# Patient Record
Sex: Male | Born: 1960
Health system: Southern US, Community
[De-identification: ages and names within clinical notes are randomized; demographics above are authoritative.]

## PROBLEM LIST (undated history)

## (undated) DIAGNOSIS — I10 Essential (primary) hypertension: Secondary | ICD-10-CM

## (undated) DIAGNOSIS — F1910 Other psychoactive substance abuse, uncomplicated: Secondary | ICD-10-CM

## (undated) DIAGNOSIS — I499 Cardiac arrhythmia, unspecified: Secondary | ICD-10-CM

## (undated) HISTORY — PX: NERVE SURGERY: SHX1016

## (undated) HISTORY — PX: MANDIBLE FRACTURE SURGERY: SHX706

---

## 2013-02-10 DIAGNOSIS — Z720 Tobacco use: Secondary | ICD-10-CM | POA: Insufficient documentation

## 2013-02-10 DIAGNOSIS — J45909 Unspecified asthma, uncomplicated: Secondary | ICD-10-CM | POA: Insufficient documentation

## 2013-02-10 DIAGNOSIS — F1494 Cocaine use, unspecified with cocaine-induced mood disorder: Secondary | ICD-10-CM | POA: Insufficient documentation

## 2013-02-10 DIAGNOSIS — F14988 Cocaine use, unspecified with other cocaine-induced disorder: Secondary | ICD-10-CM | POA: Insufficient documentation

## 2013-02-10 DIAGNOSIS — J40 Bronchitis, not specified as acute or chronic: Secondary | ICD-10-CM | POA: Insufficient documentation

## 2014-07-24 DIAGNOSIS — J040 Acute laryngitis: Secondary | ICD-10-CM | POA: Diagnosis not present

## 2014-08-06 DIAGNOSIS — F333 Major depressive disorder, recurrent, severe with psychotic symptoms: Secondary | ICD-10-CM | POA: Diagnosis not present

## 2014-08-14 DIAGNOSIS — E1151 Type 2 diabetes mellitus with diabetic peripheral angiopathy without gangrene: Secondary | ICD-10-CM | POA: Diagnosis not present

## 2014-08-14 DIAGNOSIS — L84 Corns and callosities: Secondary | ICD-10-CM | POA: Diagnosis not present

## 2014-08-23 DIAGNOSIS — Z79899 Other long term (current) drug therapy: Secondary | ICD-10-CM | POA: Diagnosis not present

## 2014-10-15 DIAGNOSIS — F333 Major depressive disorder, recurrent, severe with psychotic symptoms: Secondary | ICD-10-CM | POA: Diagnosis not present

## 2014-10-30 DIAGNOSIS — F333 Major depressive disorder, recurrent, severe with psychotic symptoms: Secondary | ICD-10-CM | POA: Diagnosis not present

## 2014-11-15 DIAGNOSIS — G44209 Tension-type headache, unspecified, not intractable: Secondary | ICD-10-CM | POA: Insufficient documentation

## 2014-12-18 DIAGNOSIS — I10 Essential (primary) hypertension: Secondary | ICD-10-CM | POA: Diagnosis not present

## 2014-12-18 DIAGNOSIS — R911 Solitary pulmonary nodule: Secondary | ICD-10-CM | POA: Insufficient documentation

## 2014-12-18 DIAGNOSIS — F17210 Nicotine dependence, cigarettes, uncomplicated: Secondary | ICD-10-CM | POA: Diagnosis not present

## 2014-12-18 DIAGNOSIS — F329 Major depressive disorder, single episode, unspecified: Secondary | ICD-10-CM | POA: Diagnosis not present

## 2014-12-18 DIAGNOSIS — Z720 Tobacco use: Secondary | ICD-10-CM | POA: Diagnosis not present

## 2014-12-18 DIAGNOSIS — J439 Emphysema, unspecified: Secondary | ICD-10-CM | POA: Diagnosis not present

## 2014-12-18 DIAGNOSIS — Z716 Tobacco abuse counseling: Secondary | ICD-10-CM | POA: Diagnosis not present

## 2014-12-26 DIAGNOSIS — R911 Solitary pulmonary nodule: Secondary | ICD-10-CM | POA: Diagnosis not present

## 2014-12-26 DIAGNOSIS — J439 Emphysema, unspecified: Secondary | ICD-10-CM | POA: Diagnosis not present

## 2015-01-07 DIAGNOSIS — F333 Major depressive disorder, recurrent, severe with psychotic symptoms: Secondary | ICD-10-CM | POA: Diagnosis not present

## 2015-01-09 DIAGNOSIS — F333 Major depressive disorder, recurrent, severe with psychotic symptoms: Secondary | ICD-10-CM | POA: Diagnosis not present

## 2015-02-13 DIAGNOSIS — F333 Major depressive disorder, recurrent, severe with psychotic symptoms: Secondary | ICD-10-CM | POA: Diagnosis not present

## 2015-04-08 DIAGNOSIS — F333 Major depressive disorder, recurrent, severe with psychotic symptoms: Secondary | ICD-10-CM | POA: Diagnosis not present

## 2015-04-24 DIAGNOSIS — F333 Major depressive disorder, recurrent, severe with psychotic symptoms: Secondary | ICD-10-CM | POA: Diagnosis not present

## 2015-05-15 DIAGNOSIS — F333 Major depressive disorder, recurrent, severe with psychotic symptoms: Secondary | ICD-10-CM | POA: Diagnosis not present

## 2015-08-03 DIAGNOSIS — M79645 Pain in left finger(s): Secondary | ICD-10-CM | POA: Diagnosis not present

## 2015-08-03 DIAGNOSIS — J45909 Unspecified asthma, uncomplicated: Secondary | ICD-10-CM | POA: Diagnosis not present

## 2015-08-03 DIAGNOSIS — S61211A Laceration without foreign body of left index finger without damage to nail, initial encounter: Secondary | ICD-10-CM | POA: Diagnosis not present

## 2015-08-03 DIAGNOSIS — L089 Local infection of the skin and subcutaneous tissue, unspecified: Secondary | ICD-10-CM | POA: Diagnosis not present

## 2015-08-03 DIAGNOSIS — S6992XA Unspecified injury of left wrist, hand and finger(s), initial encounter: Secondary | ICD-10-CM | POA: Diagnosis not present

## 2015-08-03 DIAGNOSIS — F17210 Nicotine dependence, cigarettes, uncomplicated: Secondary | ICD-10-CM | POA: Diagnosis not present

## 2015-08-03 DIAGNOSIS — S60411A Abrasion of left index finger, initial encounter: Secondary | ICD-10-CM | POA: Diagnosis not present

## 2015-08-07 DIAGNOSIS — F333 Major depressive disorder, recurrent, severe with psychotic symptoms: Secondary | ICD-10-CM | POA: Diagnosis not present

## 2015-08-14 DIAGNOSIS — L089 Local infection of the skin and subcutaneous tissue, unspecified: Secondary | ICD-10-CM | POA: Insufficient documentation

## 2015-09-17 DIAGNOSIS — F333 Major depressive disorder, recurrent, severe with psychotic symptoms: Secondary | ICD-10-CM | POA: Diagnosis not present

## 2015-10-05 DIAGNOSIS — R079 Chest pain, unspecified: Secondary | ICD-10-CM | POA: Diagnosis not present

## 2015-10-05 DIAGNOSIS — I440 Atrioventricular block, first degree: Secondary | ICD-10-CM | POA: Diagnosis not present

## 2015-10-05 DIAGNOSIS — R071 Chest pain on breathing: Secondary | ICD-10-CM | POA: Diagnosis not present

## 2015-10-05 DIAGNOSIS — R0602 Shortness of breath: Secondary | ICD-10-CM | POA: Diagnosis not present

## 2015-10-05 DIAGNOSIS — F17210 Nicotine dependence, cigarettes, uncomplicated: Secondary | ICD-10-CM | POA: Diagnosis not present

## 2015-10-05 DIAGNOSIS — J45909 Unspecified asthma, uncomplicated: Secondary | ICD-10-CM | POA: Diagnosis not present

## 2015-10-06 DIAGNOSIS — I440 Atrioventricular block, first degree: Secondary | ICD-10-CM | POA: Diagnosis not present

## 2015-10-06 DIAGNOSIS — R079 Chest pain, unspecified: Secondary | ICD-10-CM | POA: Diagnosis not present

## 2015-11-05 DIAGNOSIS — F333 Major depressive disorder, recurrent, severe with psychotic symptoms: Secondary | ICD-10-CM | POA: Diagnosis not present

## 2015-11-05 DIAGNOSIS — R45851 Suicidal ideations: Secondary | ICD-10-CM | POA: Diagnosis not present

## 2015-11-05 DIAGNOSIS — F329 Major depressive disorder, single episode, unspecified: Secondary | ICD-10-CM | POA: Diagnosis not present

## 2015-11-05 DIAGNOSIS — Z9119 Patient's noncompliance with other medical treatment and regimen: Secondary | ICD-10-CM | POA: Diagnosis not present

## 2015-11-07 DIAGNOSIS — F319 Bipolar disorder, unspecified: Secondary | ICD-10-CM | POA: Diagnosis not present

## 2015-11-13 DIAGNOSIS — Z452 Encounter for adjustment and management of vascular access device: Secondary | ICD-10-CM | POA: Diagnosis not present

## 2015-11-26 DIAGNOSIS — F3181 Bipolar II disorder: Secondary | ICD-10-CM | POA: Diagnosis not present

## 2015-12-10 ENCOUNTER — Emergency Department (HOSPITAL_COMMUNITY)
Admission: EM | Admit: 2015-12-10 | Discharge: 2015-12-11 | Disposition: A | Discharge status: Home / Self Care | Payer: Medicare Other | Attending: Emergency Medicine | Admitting: Emergency Medicine

## 2015-12-10 ENCOUNTER — Encounter (HOSPITAL_COMMUNITY): Payer: Self-pay | Admitting: *Deleted

## 2015-12-10 ENCOUNTER — Emergency Department (HOSPITAL_COMMUNITY): Payer: Medicare Other

## 2015-12-10 DIAGNOSIS — Z5181 Encounter for therapeutic drug level monitoring: Secondary | ICD-10-CM | POA: Diagnosis not present

## 2015-12-10 DIAGNOSIS — R0789 Other chest pain: Secondary | ICD-10-CM | POA: Insufficient documentation

## 2015-12-10 DIAGNOSIS — R079 Chest pain, unspecified: Secondary | ICD-10-CM | POA: Diagnosis not present

## 2015-12-10 DIAGNOSIS — F17200 Nicotine dependence, unspecified, uncomplicated: Secondary | ICD-10-CM | POA: Diagnosis not present

## 2015-12-10 LAB — CBC
HCT: 38.2 % — ABNORMAL LOW (ref 39.0–52.0)
Hemoglobin: 13.2 g/dL (ref 13.0–17.0)
MCH: 33.6 pg (ref 26.0–34.0)
MCHC: 34.6 g/dL (ref 30.0–36.0)
MCV: 97.2 fL (ref 78.0–100.0)
Platelets: 209 10*3/uL (ref 150–400)
RBC: 3.93 MIL/uL — ABNORMAL LOW (ref 4.22–5.81)
RDW: 12.6 % (ref 11.5–15.5)
WBC: 5.3 10*3/uL (ref 4.0–10.5)

## 2015-12-10 LAB — RAPID URINE DRUG SCREEN, HOSP PERFORMED
Amphetamines: NOT DETECTED
Barbiturates: NOT DETECTED
Benzodiazepines: NOT DETECTED
Cocaine: NOT DETECTED
Opiates: NOT DETECTED
Tetrahydrocannabinol: NOT DETECTED

## 2015-12-10 LAB — BASIC METABOLIC PANEL
Anion gap: 5 (ref 5–15)
BUN: 15 mg/dL (ref 6–20)
CO2: 25 mmol/L (ref 22–32)
Calcium: 9.3 mg/dL (ref 8.9–10.3)
Chloride: 108 mmol/L (ref 101–111)
Creatinine, Ser: 1.06 mg/dL (ref 0.61–1.24)
GFR calc Af Amer: >60 mL/min (ref >60)
GFR calc non Af Amer: >60 mL/min (ref >60)
Glucose, Bld: 96 mg/dL (ref 65–99)
Potassium: 4.2 mmol/L (ref 3.5–5.1)
Sodium: 138 mmol/L (ref 135–145)

## 2015-12-10 LAB — D-DIMER, QUANTITATIVE: D-Dimer, Quant: 0.46 ug/mL-FEU (ref 0.00–0.50)

## 2015-12-10 LAB — TROPONIN I: Troponin I: <0.03 ng/mL (ref <0.03)

## 2015-12-10 MED ORDER — NITROGLYCERIN 0.4 MG SL SUBL
0.4000 mg | SUBLINGUAL_TABLET | SUBLINGUAL | Status: DC | PRN
  Administered 2015-12-10: 0.4 mg via SUBLINGUAL
  Filled 2015-12-10: qty 1

## 2015-12-10 MED ORDER — GI COCKTAIL ~~LOC~~
30.0000 mL | Freq: Once | ORAL | Status: AC
  Administered 2015-12-10: 30 mL via ORAL
  Filled 2015-12-10: qty 30

## 2015-12-10 MED ORDER — KETOROLAC TROMETHAMINE 30 MG/ML IJ SOLN
15.0000 mg | Freq: Once | INTRAMUSCULAR | Status: AC
  Administered 2015-12-10: 15 mg via INTRAVENOUS
  Filled 2015-12-10: qty 1

## 2015-12-10 MED ORDER — ACETAMINOPHEN 500 MG PO TABS
1000.0000 mg | ORAL_TABLET | Freq: Once | ORAL | Status: AC
  Administered 2015-12-10: 1000 mg via ORAL
  Filled 2015-12-10: qty 2

## 2015-12-10 MED ORDER — ASPIRIN 81 MG PO CHEW
324.0000 mg | CHEWABLE_TABLET | Freq: Once | ORAL | Status: AC
  Administered 2015-12-10: 324 mg via ORAL
  Filled 2015-12-10: qty 4

## 2015-12-10 NOTE — ED Provider Notes (Signed)
CSN: [id number]     Arrival date & time 12/10/15  2052 History   First MD Initiated Contact with Patient 12/10/15 2101     Chief Complaint  Patient presents with  . Chest Pain      HPI  Pt was seen at 2110. Per pt, c/o gradual onset and persistence of constant left sided chest wall "pain" that began approximately 1530 PTA. Pt states he was sitting in a chair when his symptoms began. Has been associated with SOB. Describes the CP as "sharp" and constant since onset. Endorses hx of similar symptoms, states "they told me it was from doing drugs." States last drug use was 2 months ago. Denies palpitations, no SOB/cough, no abd pain, no N/V/D, no back pain, no rash, no fevers.      History reviewed. No pertinent past medical history.   History reviewed. No pertinent past surgical history.  Social History  Substance Use Topics  . Smoking status: Current Every Day Smoker  . Smokeless tobacco: None  . Alcohol Use: Yes     Comment: last use was Nov 06 2015    Review of Systems ROS: Statement: All systems negative except as marked or noted in the HPI; Constitutional: Negative for fever and chills. ; ; Eyes: Negative for eye pain, redness and discharge. ; ; ENMT: Negative for ear pain, hoarseness, nasal congestion, sinus pressure and sore throat. ; ; Cardiovascular: +CP, SOB. Negative for palpitations, diaphoresis, and peripheral edema. ; ; Respiratory: Negative for cough, wheezing and stridor. ; ; Gastrointestinal: Negative for nausea, vomiting, diarrhea, abdominal pain, blood in stool, hematemesis, jaundice and rectal bleeding. . ; ; Genitourinary: Negative for dysuria, flank pain and hematuria. ; ; Musculoskeletal: Negative for back pain and neck pain. Negative for swelling and trauma.; ; Skin: Negative for pruritus, rash, abrasions, blisters, bruising and skin lesion.; ; Neuro: Negative for headache, lightheadedness and neck stiffness. Negative for weakness, altered level of consciousness, altered  mental status, extremity weakness, paresthesias, involuntary movement, seizure and syncope.      Allergies  Review of patient's allergies indicates no known allergies.  Home Medications   Prior to Admission medications   Not on File   BP 139/77 mmHg  Pulse 68  Temp(Src) 97.8 F (36.6 C) (Oral)  Resp 18  Ht 5\' 6"  (1.676 m)  Wt 145 lb (65.772 kg)  BMI 23.41 kg/m2  SpO2 98% Physical Exam  2115: Physical examination:  Nursing notes reviewed; Vital signs and O2 SAT reviewed;  Constitutional: Well developed, Well nourished, Well hydrated, In no acute distress; Head:  Normocephalic, atraumatic; Eyes: EOMI, PERRL, No scleral icterus; ENMT: Mouth and pharynx normal, Mucous membranes moist; Neck: Supple, Full range of motion, No lymphadenopathy; Cardiovascular: Regular rate and rhythm, No gallop; Respiratory: Breath sounds clear & equal bilaterally, No wheezes.  Speaking full sentences with ease, Normal respiratory effort/excursion; Chest: +mild TTP left mid-chest wall. No deformity, no soft tissue crepitus. Movement normal; Abdomen: Soft, Nontender, Nondistended, Normal bowel sounds; Genitourinary: No CVA tenderness; Extremities: Pulses normal, No tenderness, No edema, No calf edema or asymmetry.; Neuro: AA&Ox3, Major CN grossly intact.  Speech clear. No gross focal motor or sensory deficits in extremities.; Skin: Color normal, Warm, Dry.   ED Course  Procedures (including critical care time) Labs Review  Imaging Review  I have personally reviewed and evaluated these images and lab results as part of my medical decision-making.   EKG Interpretation   Date/Time:  Tuesday December 10 2015 21:01:43 EDT Ventricular Rate:  68 PR Interval:    QRS Duration: 87 QT Interval:  388 QTC Calculation: 413 R Axis:   77 Text Interpretation:  Sinus rhythm Borderline prolonged PR interval No old  tracing to compare Confirmed by [facility] Center IncMCMANUS  MD, Nicholos JohnsKATHLEEN [phone] on 12/10/2015  9:16:03 PM      MDM   MDM Reviewed: nursing note and vitals Interpretation: labs, ECG and x-ray     Results for orders placed or performed during the hospital encounter of 12/10/15  Basic metabolic panel  Result Value Ref Range   Sodium 138 135 - 145 mmol/L   Potassium 4.2 3.5 - 5.1 mmol/L   Chloride 108 101 - 111 mmol/L   CO2 25 22 - 32 mmol/L   Glucose, Bld 96 65 - 99 mg/dL   BUN 15 6 - 20 mg/dL   Creatinine, Ser 7.821.06 0.61 - 1.24 mg/dL   Calcium 9.3 8.9 - 95.610.3 mg/dL   GFR calc non Af Amer >60 >60 mL/min   GFR calc Af Amer >60 >60 mL/min   Anion gap 5 5 - 15  CBC  Result Value Ref Range   WBC 5.3 4.0 - 10.5 K/uL   RBC 3.93 (L) 4.22 - 5.81 MIL/uL   Hemoglobin 13.2 13.0 - 17.0 g/dL   HCT 21.338.2 (L) 08.639.0 - 57.852.0 %   MCV 97.2 78.0 - 100.0 fL   MCH 33.6 26.0 - 34.0 pg   MCHC 34.6 30.0 - 36.0 g/dL   RDW 46.912.6 62.911.5 - 52.815.5 %   Platelets 209 150 - 400 K/uL  Troponin I  Result Value Ref Range   Troponin I <0.03 <0.03 ng/mL  Urine rapid drug screen (hosp performed)  Result Value Ref Range   Opiates NONE DETECTED NONE DETECTED   Cocaine NONE DETECTED NONE DETECTED   Benzodiazepines NONE DETECTED NONE DETECTED   Amphetamines NONE DETECTED NONE DETECTED   Tetrahydrocannabinol NONE DETECTED NONE DETECTED   Barbiturates NONE DETECTED NONE DETECTED  D-dimer, quantitative  Result Value Ref Range   D-Dimer, Quant 0.46 0.00 - 0.50 ug/mL-FEU   Dg Chest 2 View 12/10/2015  CLINICAL DATA:  Acute onset of left-sided chest pain and shortness of breath. Initial encounter. EXAM: CHEST  2 VIEW COMPARISON:  None. FINDINGS: The lungs are well-aerated and clear. There is no evidence of focal opacification, pleural effusion or pneumothorax. The heart is normal in size; the mediastinal contour is within normal limits. No acute osseous abnormalities are seen. IMPRESSION: No acute cardiopulmonary process seen. Electronically Signed   By: Roanna RaiderJeffery  Chang M.D.   On: 12/10/2015 22:04    2310:  No change in CP after ASA, SL ntg and  GI cocktail. Workup reassuring. Will check delta troponin at 0030 (9 hours after onset of symptoms).  Sign out to Dr. Lynelle DoctorKnapp.   Samuel JesterKathleen Luca Burston, DO 12/10/15 2314

## 2015-12-10 NOTE — ED Notes (Signed)
Pt c/o left side chest pain that is described as sharp with sob that started this afternoon, denies any n/v, radiation of pain,

## 2015-12-11 LAB — TROPONIN I: Troponin I: <0.03 ng/mL (ref <0.03)

## 2015-12-11 MED ORDER — NAPROXEN 250 MG PO TABS: ORAL_TABLET | ORAL | Status: DC

## 2015-12-11 MED ORDER — CYCLOBENZAPRINE HCL 5 MG PO TABS: 5.0000 mg | ORAL_TABLET | Freq: Three times a day (TID) | ORAL | Status: DC | PRN

## 2015-12-11 NOTE — Discharge Instructions (Signed)
Use ice or heat to the area for comfort if the pain returns. Take the medication as prescribed if the pain returns. Recheck if you get a cough, fever, struggle to breathe or the pain seems to be getting worse instead of better.    Chest Wall Pain Chest wall pain is pain in or around the bones and muscles of your chest. Sometimes, an injury causes this pain. Sometimes, the cause may not be known. This pain may take several weeks or longer to get better. HOME CARE Pay attention to any changes in your symptoms. Take these actions to help with your pain:  Rest as told by your doctor.  Avoid activities that cause pain. Try not to use your chest, belly (abdominal), or side muscles to lift heavy things.  If directed, apply ice to the painful area:  Put ice in a plastic bag.  Place a towel between your skin and the bag.  Leave the ice on for 20 minutes, 2-3 times per day.  Take over-the-counter and prescription medicines only as told by your doctor.  Do not use tobacco products, including cigarettes, chewing tobacco, and e-cigarettes. If you need help quitting, ask your doctor.  Keep all follow-up visits as told by your doctor. This is important. GET HELP IF:  You have a fever.  Your chest pain gets worse.  You have new symptoms. GET HELP RIGHT AWAY IF:  You feel sick to your stomach (nauseous) or you throw up (vomit).  You feel sweaty or light-headed.  You have a cough with phlegm (sputum) or you cough up blood.  You are short of breath.   This information is not intended to replace advice given to you by your health care provider. Make sure you discuss any questions you have with your health care provider.   Document Released: 11/11/2007 Document Revised: 02/13/2015 Document Reviewed: 08/20/2014 Elsevier Interactive Patient Education [employer]2016 [employer] Inc.

## 2015-12-11 NOTE — ED Notes (Signed)
[facility]; pt stated he would walk to residence

## 2015-12-11 NOTE — ED Provider Notes (Signed)
Patient was left at change of shift to get a second, delta troponin for left-sided chest pain. His chest pain started about 3 PM this afternoon. He describes it as sharp and constant and points to his left chest area medial to his nipple line. He denies any change in his activity. Patient was given the results of his second troponin which was negative. He states his pain is gone at this time and he feels ready to be discharged.  Patient is staying in a recovery house for relapsing on cocaine after being sober for 3 years. He however reports he has not done it for 2 months. He is requesting paperwork showing what medications he got in the ED tonight. He was informed his discharge papers will show what medications he was prescribed.  . Results for orders placed or performed during the hospital encounter of 12/10/15  Basic metabolic panel  Result Value Ref Range   Sodium 138 135 - 145 mmol/L   Potassium 4.2 3.5 - 5.1 mmol/L   Chloride 108 101 - 111 mmol/L   CO2 25 22 - 32 mmol/L   Glucose, Bld 96 65 - 99 mg/dL   BUN 15 6 - 20 mg/dL   Creatinine, Ser 1.611.06 0.61 - 1.24 mg/dL   Calcium 9.3 8.9 - 09.610.3 mg/dL   GFR calc non Af Amer >60 >60 mL/min   GFR calc Af Amer >60 >60 mL/min   Anion gap 5 5 - 15  CBC  Result Value Ref Range   WBC 5.3 4.0 - 10.5 K/uL   RBC 3.93 (L) 4.22 - 5.81 MIL/uL   Hemoglobin 13.2 13.0 - 17.0 g/dL   HCT 04.538.2 (L) 40.939.0 - 81.152.0 %   MCV 97.2 78.0 - 100.0 fL   MCH 33.6 26.0 - 34.0 pg   MCHC 34.6 30.0 - 36.0 g/dL   RDW 91.412.6 78.211.5 - 95.615.5 %   Platelets 209 150 - 400 K/uL  Troponin I  Result Value Ref Range   Troponin I <0.03 <0.03 ng/mL  Urine rapid drug screen (hosp performed)  Result Value Ref Range   Opiates NONE DETECTED NONE DETECTED   Cocaine NONE DETECTED NONE DETECTED   Benzodiazepines NONE DETECTED NONE DETECTED   Amphetamines NONE DETECTED NONE DETECTED   Tetrahydrocannabinol NONE DETECTED NONE DETECTED   Barbiturates NONE DETECTED NONE DETECTED  D-dimer,  quantitative  Result Value Ref Range   D-Dimer, Quant 0.46 0.00 - 0.50 ug/mL-FEU  Troponin I  Result Value Ref Range   Troponin I <0.03 <0.03 ng/mL   Laboratory interpretation all normal   Diagnoses that have been ruled out:  None  Diagnoses that are still under consideration:  None  Final diagnoses:  Atypical chest pain    New Prescriptions   CYCLOBENZAPRINE (FLEXERIL) 5 MG TABLET    Take 1 tablet (5 mg total) by mouth 3 (three) times daily as needed.   NAPROXEN (NAPROSYN) 250 MG TABLET    Take 1 po BID with food prn pain    Plan discharge  Devoria AlbeIva Syed Zukas, MD, [facility] PyoFACEP      Evora Schechter, MD 12/11/15 0120

## 2015-12-23 DIAGNOSIS — F329 Major depressive disorder, single episode, unspecified: Secondary | ICD-10-CM | POA: Diagnosis not present

## 2016-04-15 DIAGNOSIS — I2510 Atherosclerotic heart disease of native coronary artery without angina pectoris: Secondary | ICD-10-CM | POA: Diagnosis not present

## 2016-04-15 DIAGNOSIS — E559 Vitamin D deficiency, unspecified: Secondary | ICD-10-CM | POA: Diagnosis not present

## 2016-04-15 DIAGNOSIS — Z13220 Encounter for screening for lipoid disorders: Secondary | ICD-10-CM | POA: Diagnosis not present

## 2016-04-15 DIAGNOSIS — Z0389 Encounter for observation for other suspected diseases and conditions ruled out: Secondary | ICD-10-CM | POA: Diagnosis not present

## 2016-04-15 DIAGNOSIS — Z8659 Personal history of other mental and behavioral disorders: Secondary | ICD-10-CM | POA: Diagnosis not present

## 2016-04-15 DIAGNOSIS — Z125 Encounter for screening for malignant neoplasm of prostate: Secondary | ICD-10-CM | POA: Diagnosis not present

## 2016-04-15 DIAGNOSIS — R5383 Other fatigue: Secondary | ICD-10-CM | POA: Diagnosis not present

## 2016-04-29 DIAGNOSIS — I2510 Atherosclerotic heart disease of native coronary artery without angina pectoris: Secondary | ICD-10-CM | POA: Diagnosis not present

## 2016-04-29 DIAGNOSIS — Z23 Encounter for immunization: Secondary | ICD-10-CM | POA: Diagnosis not present

## 2016-04-29 DIAGNOSIS — E559 Vitamin D deficiency, unspecified: Secondary | ICD-10-CM | POA: Diagnosis not present

## 2016-04-29 DIAGNOSIS — R5383 Other fatigue: Secondary | ICD-10-CM | POA: Diagnosis not present

## 2016-05-07 DIAGNOSIS — F329 Major depressive disorder, single episode, unspecified: Secondary | ICD-10-CM | POA: Diagnosis not present

## 2016-05-19 ENCOUNTER — Encounter (HOSPITAL_COMMUNITY): Payer: Self-pay | Admitting: Emergency Medicine

## 2016-05-19 ENCOUNTER — Emergency Department (HOSPITAL_COMMUNITY): Payer: Medicare Other

## 2016-05-19 ENCOUNTER — Emergency Department (HOSPITAL_COMMUNITY)
Admission: EM | Admit: 2016-05-19 | Discharge: 2016-05-19 | Disposition: A | Discharge status: Home / Self Care | Payer: Medicare Other | Attending: Emergency Medicine | Admitting: Emergency Medicine

## 2016-05-19 DIAGNOSIS — R05 Cough: Secondary | ICD-10-CM | POA: Diagnosis not present

## 2016-05-19 DIAGNOSIS — R0789 Other chest pain: Secondary | ICD-10-CM | POA: Diagnosis not present

## 2016-05-19 DIAGNOSIS — R0602 Shortness of breath: Secondary | ICD-10-CM | POA: Diagnosis not present

## 2016-05-19 DIAGNOSIS — F17210 Nicotine dependence, cigarettes, uncomplicated: Secondary | ICD-10-CM | POA: Insufficient documentation

## 2016-05-19 DIAGNOSIS — R079 Chest pain, unspecified: Secondary | ICD-10-CM | POA: Diagnosis not present

## 2016-05-19 HISTORY — DX: Cardiac arrhythmia, unspecified: I49.9

## 2016-05-19 LAB — CBC
HCT: 43.9 % (ref 39.0–52.0)
Hemoglobin: 14.4 g/dL (ref 13.0–17.0)
MCH: 32.2 pg (ref 26.0–34.0)
MCHC: 32.8 g/dL (ref 30.0–36.0)
MCV: 98.2 fL (ref 78.0–100.0)
Platelets: 223 10*3/uL (ref 150–400)
RBC: 4.47 MIL/uL (ref 4.22–5.81)
RDW: 12.4 % (ref 11.5–15.5)
WBC: 6 10*3/uL (ref 4.0–10.5)

## 2016-05-19 LAB — BASIC METABOLIC PANEL
Anion gap: 6 (ref 5–15)
BUN: 15 mg/dL (ref 6–20)
CO2: 25 mmol/L (ref 22–32)
Calcium: 9.6 mg/dL (ref 8.9–10.3)
Chloride: 106 mmol/L (ref 101–111)
Creatinine, Ser: 1.57 mg/dL — ABNORMAL HIGH (ref 0.61–1.24)
GFR calc Af Amer: 55 mL/min — ABNORMAL LOW (ref >60)
GFR calc non Af Amer: 47 mL/min — ABNORMAL LOW (ref >60)
Glucose, Bld: 96 mg/dL (ref 65–99)
Potassium: 3.9 mmol/L (ref 3.5–5.1)
Sodium: 137 mmol/L (ref 135–145)

## 2016-05-19 LAB — I-STAT TROPONIN, ED
Troponin i, poc: 0 ng/mL (ref 0.00–0.08)
Troponin i, poc: 0 ng/mL (ref 0.00–0.08)

## 2016-05-19 NOTE — Discharge Instructions (Signed)
Your heart work up was reassuring today.  Return for worsening symptoms,including worsening pain, passing out, difficulty breathing or any other symptoms concerning to you.  Follow-up with your heart doctor at [facility].

## 2016-05-19 NOTE — ED Provider Notes (Signed)
[facility]-EMERGENCY DEPT Provider Note   CSN: [id number] Arrival date & time: 05/19/16  1757     History   Chief Complaint Chief Complaint  Patient presents with  . Chest Pain    HPI Chad Frank is a 55 y.o. male.  HPI 55 year old male who presents with chest pain. He has a history of tobacco use and prior history of drug abuse, and has been in remission for 3 months. Presents with chest pain after waking up from a nap at around 5:55 PM tonight. Pain described as pressure, gradually worsening, and associated with mild shortness of breath. States that he has had similar pain in the past, primarily when he is angry and under a lot of stress. States that symptoms usually go away when he is able to be by himself and able to relax. States that this is near the anniversary of his father's death 1 year ago around Christmas time, and states that her only child passed away after [place] this year as well.  Denies any cough, fever, diaphoresis. Denies that pain is worse with position movement or exertional activity. No family history of cardiac disease. No prior history of DVT or PE or family history of that. No recent immobilization, lower extremity edema or calf tenderness.  Past Medical History:  Diagnosis Date  . Irregular heart rhythm     There are no active problems to display for this patient.   History reviewed. No pertinent surgical history.     Home Medications    Prior to Admission medications   Medication Sig Start Date End Date Taking? Authorizing Provider  FLUoxetine (PROZAC) 20 MG capsule Take 20 mg by mouth daily. 04/28/16  Yes Historical Provider, MD  traZODone (DESYREL) 100 MG tablet Take 100 mg by mouth at bedtime. 04/28/16  Yes Historical Provider, MD    Family History History reviewed. No pertinent family history.  Social History Social History  Substance Use Topics  . Smoking status: Current Every Day Smoker    Packs/day: 0.50    Types: Cigarettes  .  Smokeless tobacco: Never Used  . Alcohol use No     Comment: last use was Nov 06 2015     Allergies   Patient has no known allergies.   Review of Systems Review of Systems 10/14 systems reviewed and are negative other than those stated in the HPI   Physical Exam Updated Vital Signs BP 134/70   Pulse (!) 58   Temp 97.9 F (36.6 C) (Oral)   Resp 15   Ht 5\' 6"  (1.676 m)   Wt 160 lb (72.6 kg)   SpO2 96%   BMI 25.82 kg/m   Physical Exam Physical Exam  Nursing note and vitals reviewed. Constitutional: Well developed, well nourished, non-toxic, and in no acute distress Head: Normocephalic and atraumatic.  Mouth/Throat: Oropharynx is clear and moist.  Neck: Normal range of motion. Neck supple.  Cardiovascular: Normal rate and regular rhythm.   Pulmonary/Chest: Effort normal and breath sounds normal.  Abdominal: Soft. There is no tenderness. There is no rebound and no guarding.  Musculoskeletal: Normal range of motion.  Neurological: Alert, no facial droop, fluent speech, moves all extremities symmetrically Skin: Skin is warm and dry.  Psychiatric: Cooperative    ED Treatments / Results  Labs (all labs ordered are listed, but only abnormal results are displayed) Labs Reviewed  BASIC METABOLIC PANEL - Abnormal; Notable for the following:       Result Value   Creatinine, Ser 1.57 (*)  GFR calc non Af Amer 47 (*)    GFR calc Af Amer 55 (*)    All other components within normal limits  CBC  I-STAT TROPOININ, ED  I-STAT TROPOININ, ED    EKG  EKG Interpretation  Date/Time:  Tuesday May 19 2016 19:17:09 EST Ventricular Rate:  54 PR Interval:    QRS Duration: 84 QT Interval:  404 QTC Calculation: 383 R Axis:   70 Text Interpretation:  Sinus rhythm Prolonged PR interval nonspecific t-wave changes similar to last EKG  Confirmed by Kamonte Mcmichen MD, Rease Wence [phone]([id number]) on 05/19/2016 7:37:18 PM       Radiology Dg Chest 2 View  Result Date: 05/19/2016 CLINICAL DATA:   Patient states left lower chest pain x 20 minutes ago. SOB and coughing with occasional phlegm. States he has a hx of having fluid around his heart. Hx of irregular heart beat, bronchitis, and current smoker: half a pack x 37 years. EXAM: CHEST  2 VIEW COMPARISON:  12/10/2015 FINDINGS: The heart size and mediastinal contours are within normal limits. Mild interstitial accentuation, as can commonly be encountered in smokers. The lungs appear otherwise clear. The visualized skeletal structures are unremarkable. IMPRESSION: No active cardiopulmonary disease. Electronically Signed   By: Gaylyn RongWalter  Liebkemann M.D.   On: 05/19/2016 19:00    Procedures Procedures (including critical care time)  Medications Ordered in ED Medications - No data to display   Initial Impression / Assessment and Plan / ED Course  I have reviewed the triage vital signs and the nursing notes.  Pertinent labs & imaging results that were available during my care of the patient were reviewed by me and considered in my medical decision making (see chart for details).  Clinical Course     Presenting with chest pressure in the setting of stressful events recently. He is nontoxic in no acute distress with normal vital signs. His low risk for ACS but does have risk factors of age and tobacco use. Serial troponins are normal and serial EKGs without acute ischemic changes or dynamic changes. He is felt to be ruled out at this time given his low risk status. Chest x-ray visualized and shows no acute cardiopulmonary processes and the rest of his blood work is reassuring. History not suggestive of PE and no concerns for dissection or other serious intrathoracic or cardiopulmonary processes at this time. D[facility]ed [facility]. Strict return and follow-up instructions reviewed. He expressed understanding of all discharge instructions and felt comfortable with the plan of care.   Final  Clinical Impressions(s) / ED Diagnoses   Final diagnoses:  Nonspecific chest pain    New Prescriptions New Prescriptions   No medications on file     Lavera Guiseana Duo Bo Teicher, MD 05/19/16 2122

## 2016-05-19 NOTE — ED Triage Notes (Signed)
Pt reports left side chest pain since waking up from nap this afternoon. Pt reports history of same with unknown etiology. Pt alert and oriented. Pt denies sob. Pt reports increase in pain with movement.

## 2016-05-29 DIAGNOSIS — R079 Chest pain, unspecified: Secondary | ICD-10-CM | POA: Diagnosis not present

## 2016-05-29 DIAGNOSIS — F329 Major depressive disorder, single episode, unspecified: Secondary | ICD-10-CM | POA: Diagnosis not present

## 2016-05-29 DIAGNOSIS — F419 Anxiety disorder, unspecified: Secondary | ICD-10-CM | POA: Diagnosis not present

## 2016-05-29 DIAGNOSIS — F331 Major depressive disorder, recurrent, moderate: Secondary | ICD-10-CM | POA: Insufficient documentation

## 2016-05-29 DIAGNOSIS — R0789 Other chest pain: Secondary | ICD-10-CM | POA: Diagnosis not present

## 2016-05-29 DIAGNOSIS — J45909 Unspecified asthma, uncomplicated: Secondary | ICD-10-CM | POA: Diagnosis not present

## 2016-05-29 DIAGNOSIS — Z79899 Other long term (current) drug therapy: Secondary | ICD-10-CM | POA: Diagnosis not present

## 2016-05-29 DIAGNOSIS — F17200 Nicotine dependence, unspecified, uncomplicated: Secondary | ICD-10-CM | POA: Diagnosis not present

## 2016-05-29 DIAGNOSIS — F418 Other specified anxiety disorders: Secondary | ICD-10-CM | POA: Diagnosis not present

## 2016-06-04 DIAGNOSIS — I2510 Atherosclerotic heart disease of native coronary artery without angina pectoris: Secondary | ICD-10-CM | POA: Diagnosis not present

## 2016-06-04 DIAGNOSIS — Z8659 Personal history of other mental and behavioral disorders: Secondary | ICD-10-CM | POA: Diagnosis not present

## 2016-06-04 DIAGNOSIS — R5383 Other fatigue: Secondary | ICD-10-CM | POA: Diagnosis not present

## 2016-08-03 DIAGNOSIS — I10 Essential (primary) hypertension: Secondary | ICD-10-CM | POA: Diagnosis not present

## 2016-08-24 DIAGNOSIS — F329 Major depressive disorder, single episode, unspecified: Secondary | ICD-10-CM | POA: Diagnosis not present

## 2016-08-27 DIAGNOSIS — I10 Essential (primary) hypertension: Secondary | ICD-10-CM | POA: Diagnosis not present

## 2016-09-25 DIAGNOSIS — Z0000 Encounter for general adult medical examination without abnormal findings: Secondary | ICD-10-CM | POA: Diagnosis not present

## 2016-09-25 DIAGNOSIS — Z23 Encounter for immunization: Secondary | ICD-10-CM | POA: Diagnosis not present

## 2016-09-25 DIAGNOSIS — F17210 Nicotine dependence, cigarettes, uncomplicated: Secondary | ICD-10-CM | POA: Diagnosis not present

## 2016-09-25 DIAGNOSIS — D649 Anemia, unspecified: Secondary | ICD-10-CM | POA: Insufficient documentation

## 2016-09-25 DIAGNOSIS — M545 Low back pain: Secondary | ICD-10-CM | POA: Diagnosis not present

## 2016-09-25 DIAGNOSIS — E785 Hyperlipidemia, unspecified: Secondary | ICD-10-CM | POA: Diagnosis not present

## 2016-09-25 DIAGNOSIS — Z720 Tobacco use: Secondary | ICD-10-CM | POA: Diagnosis not present

## 2016-09-25 DIAGNOSIS — I10 Essential (primary) hypertension: Secondary | ICD-10-CM | POA: Diagnosis not present

## 2016-09-25 DIAGNOSIS — F419 Anxiety disorder, unspecified: Secondary | ICD-10-CM | POA: Diagnosis not present

## 2016-09-25 DIAGNOSIS — H547 Unspecified visual loss: Secondary | ICD-10-CM | POA: Diagnosis not present

## 2016-09-25 DIAGNOSIS — D539 Nutritional anemia, unspecified: Secondary | ICD-10-CM | POA: Diagnosis not present

## 2016-09-25 DIAGNOSIS — R799 Abnormal finding of blood chemistry, unspecified: Secondary | ICD-10-CM | POA: Diagnosis not present

## 2016-09-25 DIAGNOSIS — F329 Major depressive disorder, single episode, unspecified: Secondary | ICD-10-CM | POA: Diagnosis not present

## 2016-09-25 DIAGNOSIS — G43909 Migraine, unspecified, not intractable, without status migrainosus: Secondary | ICD-10-CM | POA: Diagnosis not present

## 2016-09-25 DIAGNOSIS — J4530 Mild persistent asthma, uncomplicated: Secondary | ICD-10-CM | POA: Diagnosis not present

## 2016-09-25 DIAGNOSIS — R911 Solitary pulmonary nodule: Secondary | ICD-10-CM | POA: Diagnosis not present

## 2016-09-25 DIAGNOSIS — Z7982 Long term (current) use of aspirin: Secondary | ICD-10-CM | POA: Diagnosis not present

## 2016-09-25 DIAGNOSIS — Z79899 Other long term (current) drug therapy: Secondary | ICD-10-CM | POA: Diagnosis not present

## 2016-10-13 ENCOUNTER — Emergency Department (HOSPITAL_COMMUNITY): Payer: Medicare Other

## 2016-10-13 ENCOUNTER — Observation Stay (HOSPITAL_COMMUNITY)
Admission: EM | Admit: 2016-10-13 | Discharge: 2016-10-14 | Disposition: A | Discharge status: Home / Self Care | Payer: Medicare Other | Attending: Internal Medicine | Admitting: Internal Medicine

## 2016-10-13 ENCOUNTER — Encounter (HOSPITAL_COMMUNITY): Payer: Self-pay | Admitting: Emergency Medicine

## 2016-10-13 DIAGNOSIS — R079 Chest pain, unspecified: Secondary | ICD-10-CM | POA: Diagnosis present

## 2016-10-13 DIAGNOSIS — F17210 Nicotine dependence, cigarettes, uncomplicated: Secondary | ICD-10-CM | POA: Insufficient documentation

## 2016-10-13 DIAGNOSIS — R0789 Other chest pain: Principal | ICD-10-CM | POA: Insufficient documentation

## 2016-10-13 DIAGNOSIS — Z79899 Other long term (current) drug therapy: Secondary | ICD-10-CM | POA: Insufficient documentation

## 2016-10-13 DIAGNOSIS — Z7982 Long term (current) use of aspirin: Secondary | ICD-10-CM | POA: Diagnosis not present

## 2016-10-13 DIAGNOSIS — I10 Essential (primary) hypertension: Secondary | ICD-10-CM | POA: Diagnosis not present

## 2016-10-13 DIAGNOSIS — F419 Anxiety disorder, unspecified: Secondary | ICD-10-CM | POA: Diagnosis not present

## 2016-10-13 DIAGNOSIS — E785 Hyperlipidemia, unspecified: Secondary | ICD-10-CM

## 2016-10-13 DIAGNOSIS — R9431 Abnormal electrocardiogram [ECG] [EKG]: Secondary | ICD-10-CM

## 2016-10-13 DIAGNOSIS — R0602 Shortness of breath: Secondary | ICD-10-CM | POA: Diagnosis not present

## 2016-10-13 HISTORY — DX: Essential (primary) hypertension: I10

## 2016-10-13 LAB — CBC WITH DIFFERENTIAL/PLATELET
Basophils Absolute: 0 10*3/uL (ref 0.0–0.1)
Basophils Relative: 0 %
Eosinophils Absolute: 0.2 10*3/uL (ref 0.0–0.7)
Eosinophils Relative: 3 %
HCT: 38.4 % — ABNORMAL LOW (ref 39.0–52.0)
Hemoglobin: 12.7 g/dL — ABNORMAL LOW (ref 13.0–17.0)
Lymphocytes Relative: 27 %
Lymphs Abs: 1.5 10*3/uL (ref 0.7–4.0)
MCH: 32.4 pg (ref 26.0–34.0)
MCHC: 33.1 g/dL (ref 30.0–36.0)
MCV: 98 fL (ref 78.0–100.0)
Monocytes Absolute: 0.6 10*3/uL (ref 0.1–1.0)
Monocytes Relative: 11 %
Neutro Abs: 3.4 10*3/uL (ref 1.7–7.7)
Neutrophils Relative %: 59 %
Platelets: 253 10*3/uL (ref 150–400)
RBC: 3.92 MIL/uL — ABNORMAL LOW (ref 4.22–5.81)
RDW: 12.8 % (ref 11.5–15.5)
WBC: 5.7 10*3/uL (ref 4.0–10.5)

## 2016-10-13 LAB — COMPREHENSIVE METABOLIC PANEL
ALT: 17 U/L (ref 17–63)
AST: 23 U/L (ref 15–41)
Albumin: 3.7 g/dL (ref 3.5–5.0)
Alkaline Phosphatase: 68 U/L (ref 38–126)
Anion gap: 6 (ref 5–15)
BUN: 11 mg/dL (ref 6–20)
CO2: 26 mmol/L (ref 22–32)
Calcium: 8.8 mg/dL — ABNORMAL LOW (ref 8.9–10.3)
Chloride: 106 mmol/L (ref 101–111)
Creatinine, Ser: 1.09 mg/dL (ref 0.61–1.24)
GFR calc Af Amer: >60 mL/min (ref >60)
GFR calc non Af Amer: >60 mL/min (ref >60)
Glucose, Bld: 116 mg/dL — ABNORMAL HIGH (ref 65–99)
Potassium: 3.9 mmol/L (ref 3.5–5.1)
Sodium: 138 mmol/L (ref 135–145)
Total Bilirubin: 0.5 mg/dL (ref 0.3–1.2)
Total Protein: 7.2 g/dL (ref 6.5–8.1)

## 2016-10-13 LAB — TROPONIN I
Troponin I: <0.03 ng/mL (ref <0.03)
Troponin I: <0.03 ng/mL (ref <0.03)
Troponin I: <0.03 ng/mL (ref <0.03)
Troponin I: <0.03 ng/mL (ref <0.03)

## 2016-10-13 LAB — RAPID URINE DRUG SCREEN, HOSP PERFORMED
Amphetamines: NOT DETECTED
Barbiturates: NOT DETECTED
Benzodiazepines: NOT DETECTED
Cocaine: NOT DETECTED
Opiates: NOT DETECTED
Tetrahydrocannabinol: NOT DETECTED

## 2016-10-13 MED ORDER — ENOXAPARIN SODIUM 40 MG/0.4ML ~~LOC~~ SOLN
40.0000 mg | SUBCUTANEOUS | Status: DC
  Administered 2016-10-13: 40 mg via SUBCUTANEOUS
  Filled 2016-10-13: qty 0.4

## 2016-10-13 MED ORDER — GI COCKTAIL ~~LOC~~: 30.0000 mL | Freq: Four times a day (QID) | ORAL | Status: DC | PRN

## 2016-10-13 MED ORDER — TRAZODONE HCL 50 MG PO TABS
100.0000 mg | ORAL_TABLET | Freq: Every day | ORAL | Status: DC
  Administered 2016-10-13: 100 mg via ORAL
  Filled 2016-10-13: qty 2

## 2016-10-13 MED ORDER — ACETAMINOPHEN 325 MG PO TABS: 650.0000 mg | ORAL_TABLET | ORAL | Status: DC | PRN

## 2016-10-13 MED ORDER — ASPIRIN EC 81 MG PO TBEC
81.0000 mg | DELAYED_RELEASE_TABLET | Freq: Every day | ORAL | Status: DC
  Administered 2016-10-14: 81 mg via ORAL
  Filled 2016-10-13: qty 1

## 2016-10-13 MED ORDER — ONDANSETRON HCL 4 MG/2ML IJ SOLN: 4.0000 mg | Freq: Four times a day (QID) | INTRAMUSCULAR | Status: DC | PRN

## 2016-10-13 MED ORDER — FLUOXETINE HCL 20 MG PO CAPS
20.0000 mg | ORAL_CAPSULE | Freq: Every day | ORAL | Status: DC
  Administered 2016-10-14: 20 mg via ORAL
  Filled 2016-10-13: qty 1

## 2016-10-13 MED ORDER — AMLODIPINE BESYLATE 5 MG PO TABS
5.0000 mg | ORAL_TABLET | Freq: Every day | ORAL | Status: DC
  Administered 2016-10-14: 5 mg via ORAL
  Filled 2016-10-13: qty 1

## 2016-10-13 MED ORDER — ATORVASTATIN CALCIUM 40 MG PO TABS: 40.0000 mg | ORAL_TABLET | Freq: Every day | ORAL | Status: DC

## 2016-10-13 NOTE — ED Provider Notes (Signed)
[facility]-EMERGENCY DEPT Provider Note   CSN: [id number] Arrival date & time: 10/13/16  1007  By signing my name below, I, Doreatha Martin, attest that this documentation has been prepared under the direction and in the presence of Doug Sou, MD. Electronically Signed: Doreatha Martin, ED Scribe. 10/13/16. 10:58 AM.     History   Chief Complaint Chief Complaint  Patient presents with  . Chest Pain    HPI Geraldine Tesar is a 56 y.o. male with h/o HTN, HLD who presents to the Emergency Department complaining of moderate, intermittent, sharp, left-sided chest discomfort that began at 5 this morning and woke him from sleep. Pt states he is not currently in pain, and his pain lasts approximately 2 minutes at a time. He also reports he was breathing heavily with onset of his pain. Pt states he has had similar pain in the past, has been evaluated by an MD and was advised to take baby ASA. He tried his inhaler with no relief of symptoms. Of note, pt received bad news about his mothers health last night. No h/o MI, DM, or cardiac cath. Pt has had stress test previously, and states it was an abnormal result regarding his HR. He was previously followed by a cardiologist 4-5 years ago. Pt reports a previous 40 year history of drug abuse, with last use 1 year ago.  He denies recent illicit drug use. He states he only smoked crack and never used IV drugs. He is a current smoker and former drinker of 1 year. NKDA. He denies other associated symptoms.  He is presently pain-free and asymptomatic. Treated by EMS with aspirin The history is provided by the patient. No language interpreter was used.    Past Medical History:  Diagnosis Date  . Hypertension   . Irregular heart rhythm    Hypercholesterolemia There are no active problems to display for this patient.   History reviewed. No pertinent surgical history.     Home Medications    Prior to Admission medications   Medication Sig Start Date End Date  Taking? Authorizing Provider  acetaminophen (TYLENOL) 325 MG tablet Take 325 mg by mouth every 6 (six) hours as needed for mild pain or headache.   Yes [provider]  albuterol (PROVENTIL HFA;VENTOLIN HFA) 108 (90 Base) MCG/ACT inhaler Inhale 2 puffs into the lungs 2 (two) times daily. 09/25/16 09/25/17 Yes [provider]  amLODipine (NORVASC) 5 MG tablet Take 5 mg by mouth daily. 09/25/16  Yes [provider]  aspirin EC 81 MG tablet Take 81 mg by mouth daily as needed. 09/25/16  Yes [provider]  atorvastatin (LIPITOR) 40 MG tablet Take 40 mg by mouth daily. 09/25/16  Yes [provider]  FLUoxetine (PROZAC) 20 MG capsule Take 20 mg by mouth daily. 04/28/16  Yes [provider]  traZODone (DESYREL) 100 MG tablet Take 100 mg by mouth at bedtime. 04/28/16  Yes [provider]    Family History History reviewed. No pertinent family history. Family history negative for cardiac disease Social History Social History  Substance Use Topics  . Smoking status: Current Every Day Smoker    Packs/day: 0.50    Types: Cigarettes  . Smokeless tobacco: Never Used  . Alcohol use No     Comment: last use was Nov 06 2015  No alcohol 1 year no illicit drugs for 1 year. Former user of crack cocaine. No history of IV drug use   Allergies   Patient has no known  allergies.   Review of Systems Review of Systems  Constitutional: Negative.   HENT: Negative.   Respiratory: Positive for shortness of breath.   Cardiovascular: Positive for chest pain.  Gastrointestinal: Negative.   Musculoskeletal: Negative.   Skin: Negative.   Neurological: Negative.   Psychiatric/Behavioral: Negative.   All other systems reviewed and are negative.    Physical Exam Updated Vital Signs BP 125/71 (BP Location: Left Arm)   Pulse 65   Temp 97.8 F (36.6 C) (Oral)   Resp 18   Ht 5\' 6"  (1.676 m)   Wt 160 lb (72.6 kg)   SpO2 98%   BMI 25.82 kg/m    Physical Exam  Constitutional: He appears well-developed and well-nourished.  HENT:  Head: Normocephalic and atraumatic.  Eyes: Conjunctivae are normal. Pupils are equal, round, and reactive to light.  Neck: Neck supple. No tracheal deviation present. No thyromegaly present.  Cardiovascular: Normal rate, regular rhythm and normal heart sounds.   No murmur heard. Pulmonary/Chest: Effort normal and breath sounds normal.  Abdominal: Soft. Bowel sounds are normal. He exhibits no distension. There is no tenderness.  Musculoskeletal: Normal range of motion. He exhibits no edema or tenderness.  Neurological: He is alert. Coordination normal.  Skin: Skin is warm and dry. No rash noted.  Psychiatric: He has a normal mood and affect.  Nursing note and vitals reviewed.    ED Treatments / Results   DIAGNOSTIC STUDIES: Oxygen Saturation is 98% on RA, normal by my interpretation.    COORDINATION OF CARE: 10:53 AM Discussed treatment plan with pt at bedside which includes labs and pt agreed to plan.    Labs (all labs ordered are listed, but only abnormal results are displayed) Labs Reviewed  COMPREHENSIVE METABOLIC PANEL  CBC WITH DIFFERENTIAL/PLATELET  TROPONIN I    EKG  EKG Interpretation  Date/Time:  Tuesday Oct 13 2016 10:12:50 EDT Ventricular Rate:  65 PR Interval:    QRS Duration: 81 QT Interval:  385 QTC Calculation: 401 R Axis:   73 Text Interpretation:  Sinus rhythm Consider left ventricular hypertrophy Nonspecific T abnormalities, diffuse leads Anterior ST elevation, probably due to LVH Baseline wander in lead(s) V4 Confirmed by Ethelda Chick  MD, Tyree Fluharty ([id number]) on 10/13/2016 10:17:34 AM       Radiology No results found.  Procedures Procedures (including critical care time)  Medications Ordered in ED Medications - No data to display  11:25 AM patient remains asymptomatic. Dr. Ardyth Harps consulted will arrange for 23 hour observation and evaluation of chest pain.  Telemetry. Chest x-ray viewed by me Results for orders placed or performed during the hospital encounter of 10/13/16  Comprehensive metabolic panel  Result Value Ref Range   Sodium 138 135 - 145 mmol/L   Potassium 3.9 3.5 - 5.1 mmol/L   Chloride 106 101 - 111 mmol/L   CO2 26 22 - 32 mmol/L   Glucose, Bld 116 (H) 65 - 99 mg/dL   BUN 11 6 - 20 mg/dL   Creatinine, Ser 1.61 0.61 - 1.24 mg/dL   Calcium 8.8 (L) 8.9 - 10.3 mg/dL   Total Protein 7.2 6.5 - 8.1 g/dL   Albumin 3.7 3.5 - 5.0 g/dL   AST 23 15 - 41 U/L   ALT 17 17 - 63 U/L   Alkaline Phosphatase 68 38 - 126 U/L   Total Bilirubin 0.5 0.3 - 1.2 mg/dL   GFR calc non Af Amer >60 >60 mL/min   GFR calc Af Amer >60 >60 mL/min  Anion gap 6 5 - 15  CBC with Differential  Result Value Ref Range   WBC 5.7 4.0 - 10.5 K/uL   RBC 3.92 (L) 4.22 - 5.81 MIL/uL   Hemoglobin 12.7 (L) 13.0 - 17.0 g/dL   HCT 96.038.4 (L) 45.439.0 - 09.852.0 %   MCV 98.0 78.0 - 100.0 fL   MCH 32.4 26.0 - 34.0 pg   MCHC 33.1 30.0 - 36.0 g/dL   RDW 11.912.8 14.711.5 - 82.915.5 %   Platelets 253 150 - 400 K/uL   Neutrophils Relative % 59 %   Neutro Abs 3.4 1.7 - 7.7 K/uL   Lymphocytes Relative 27 %   Lymphs Abs 1.5 0.7 - 4.0 K/uL   Monocytes Relative 11 %   Monocytes Absolute 0.6 0.1 - 1.0 K/uL   Eosinophils Relative 3 %   Eosinophils Absolute 0.2 0.0 - 0.7 K/uL   Basophils Relative 0 %   Basophils Absolute 0.0 0.0 - 0.1 K/uL  Troponin I  Result Value Ref Range   Troponin I <0.03 <0.03 ng/mL   Dg Chest 2 View  Result Date: 10/13/2016 CLINICAL DATA:  56 year old male with chest pain and some shortness breath relieved by inhaler. Hypertension. Initial encounter. EXAM: CHEST  2 VIEW COMPARISON:  05/19/2016. FINDINGS: Chronic lung changes without infiltrate, congestive heart failure or pneumothorax. No plain film evidence of pulmonary malignancy. Heart size top-normal. No acute osseous abnormality. IMPRESSION: Chronic lung changes without acute abnormality noted. Electronically  Signed   By: Lacy DuverneySteven  Olson M.D.   On: 10/13/2016 10:53   Initial Impression / Assessment and Plan / ED Course  I have reviewed the triage vital signs and the nursing notes.  Pertinent labs & imaging results that were available during my care of the patient were reviewed by me and considered in my medical decision making (see chart for details).     Heart score equals 4 based on history, EKG criteria and cardiac risk factors and age  Final Clinical Impressions(s) / ED Diagnoses  Diagnosis #1 chest pain arrest #2 tobacco abuse Final diagnoses:  None    New Prescriptions New Prescriptions   No medications on file    I personally performed the services described in this documentation, which was scribed in my presence. The recorded information has been reviewed and considered.     Doug SouJacubowitz, Nichoel Digiulio, MD 10/13/16 1134

## 2016-10-13 NOTE — ED Triage Notes (Signed)
PT c/o center tightness chest pain that started this am with some SOB relieved by his inhaler per pt. EMS states they gave pt 324mg  aspirin in route.

## 2016-10-13 NOTE — H&P (Signed)
History and Physical   wi Chad RoyalsJames Ra ZOX:[id number] DOB: [birth year] DOA: 10/13/2016  Referring MD/NP/PA: Doug Sousam Jacubowitz, EDP PCP: Patient, No Pcp Per  Patient coming from: Home  Chief Complaint: Chest Pain  HPI: Chad Frank is a 56 y.o. male with h/o HTN and hyperlipidemia who was awoken today at around 4:30 am from sleep with SSCP, no chest pain. Improved with nitro given in ED. VSS, he was found to have inferolateral t wave inversions on EKG with a HEART score of 4 and admission was requested. At the time of my evaluation he does not have CP.  Past Medical/Surgical History: Past Medical History:  Diagnosis Date  . Hypertension   . Irregular heart rhythm     History reviewed. No pertinent surgical history.  Social History:  reports that he has been smoking Cigarettes.  He has been smoking about 0.50 packs per day. He has never used smokeless tobacco. He reports that he does not drink alcohol or use drugs.  Allergies: No Known Allergies  Family History:  Is not aware of any relevant family history: "I am not sure what problems my folks had"  Prior to Admission medications   Medication Sig Start Date End Date Taking? Authorizing Provider  acetaminophen (TYLENOL) 325 MG tablet Take 325 mg by mouth every 6 (six) hours as needed for mild pain or headache.   Yes [provider]  albuterol (PROVENTIL HFA;VENTOLIN HFA) 108 (90 Base) MCG/ACT inhaler Inhale 2 puffs into the lungs 2 (two) times daily. 09/25/16 09/25/17 Yes [provider]  amLODipine (NORVASC) 5 MG tablet Take 5 mg by mouth daily. 09/25/16  Yes [provider]  aspirin EC 81 MG tablet Take 81 mg by mouth daily as needed. 09/25/16  Yes [provider]  atorvastatin (LIPITOR) 40 MG tablet Take 40 mg by mouth daily. 09/25/16  Yes [provider]  FLUoxetine (PROZAC) 20 MG capsule Take 20 mg by mouth daily. 04/28/16  Yes [provider]  traZODone (DESYREL) 100 MG tablet Take  100 mg by mouth at bedtime. 04/28/16  Yes [provider]    Review of Systems: Constitutional: Denies fever, chills, diaphoresis, appetite change and fatigue.  HEENT: Denies photophobia, eye pain, redness, hearing loss, ear pain, congestion, sore throat, rhinorrhea, sneezing, mouth sores, trouble swallowing, neck pain, neck stiffness and tinnitus.   Respiratory: Denies SOB, DOE, cough,  and wheezing.   Cardiovascular: Denies  palpitations and leg swelling.  Gastrointestinal: Denies nausea, vomiting, abdominal pain, diarrhea, constipation, blood in stool and abdominal distention.  Genitourinary: Denies dysuria, urgency, frequency, hematuria, flank pain and difficulty urinating.  Endocrine: Denies: hot or cold intolerance, sweats, changes in hair or nails, polyuria, polydipsia. Musculoskeletal: Denies myalgias, back pain, joint swelling, arthralgias and gait problem.  Skin: Denies pallor, rash and wound.  Neurological: Denies dizziness, seizures, syncope, weakness, light-headedness, numbness and headaches.  Hematological: Denies adenopathy. Easy bruising, personal or family bleeding history  Psychiatric/Behavioral: Denies suicidal ideation, mood changes, confusion, nervousness, sleep disturbance and agitation    Physical Exam: Vitals:   10/13/16 1100 10/13/16 1200 10/13/16 1215 10/13/16 1336  BP: (!) 134/91 135/76  (!) 142/76  Pulse: 63  (!) 56 72  Resp: 14 (!) 21 19 20   Temp:    98 F (36.7 C)  TempSrc:    Oral  SpO2: 99%  95% 100%  Weight:      Height:         Constitutional: NAD, calm, comfortable Eyes: PERRL, lids and conjunctivae normal ENMT:  Mucous membranes are moist. Posterior pharynx clear of any exudate or lesions.Normal dentition.  Neck: normal, supple, no masses, no thyromegaly Respiratory: clear to auscultation bilaterally, no wheezing, no crackles. Normal respiratory effort. No accessory muscle use.  Cardiovascular: Regular rate and rhythm, no murmurs /  rubs / gallops. No extremity edema. 2+ pedal pulses. No carotid bruits.  Abdomen: no tenderness, no masses palpated. No hepatosplenomegaly. Bowel sounds positive.  Musculoskeletal: no clubbing / cyanosis. No joint deformity upper and lower extremities. Good ROM, no contractures. Normal muscle tone.  Skin: no rashes, lesions, ulcers. No induration Neurologic: CN 2-12 grossly intact. Sensation intact, DTR normal. Strength 5/5 in all 4.  Psychiatric: Normal judgment and insight. Alert and oriented x 3. Normal mood.    Labs on Admission: I have personally reviewed the following labs and imaging studies  CBC:  Recent Labs Lab 10/13/16 1020  WBC 5.7  NEUTROABS 3.4  HGB 12.7*  HCT 38.4*  MCV 98.0  PLT 253   Basic Metabolic Panel:  Recent Labs Lab 10/13/16 1020  NA 138  K 3.9  CL 106  CO2 26  GLUCOSE 116*  BUN 11  CREATININE 1.09  CALCIUM 8.8*   GFR: Estimated Creatinine Clearance: 66.7 mL/min (by C-G formula based on SCr of 1.09 mg/dL). Liver Function Tests:  Recent Labs Lab 10/13/16 1020  AST 23  ALT 17  ALKPHOS 68  BILITOT 0.5  PROT 7.2  ALBUMIN 3.7   No results for input(s): LIPASE, AMYLASE in the last 168 hours. No results for input(s): AMMONIA in the last 168 hours. Coagulation Profile: No results for input(s): INR, PROTIME in the last 168 hours. Cardiac Enzymes:  Recent Labs Lab 10/13/16 1020  TROPONINI <0.03   BNP (last 3 results) No results for input(s): PROBNP in the last 8760 hours. HbA1C: No results for input(s): HGBA1C in the last 72 hours. CBG: No results for input(s): GLUCAP in the last 168 hours. Lipid Profile: No results for input(s): CHOL, HDL, LDLCALC, TRIG, CHOLHDL, LDLDIRECT in the last 72 hours. Thyroid Function Tests: No results for input(s): TSH, T4TOTAL, FREET4, T3FREE, THYROIDAB in the last 72 hours. Anemia Panel: No results for input(s): VITAMINB12, FOLATE, FERRITIN, TIBC, IRON, RETICCTPCT in the last 72 hours. Urine  analysis: No results found for: COLORURINE, APPEARANCEUR, LABSPEC, PHURINE, GLUCOSEU, HGBUR, BILIRUBINUR, KETONESUR, PROTEINUR, UROBILINOGEN, NITRITE, LEUKOCYTESUR Sepsis Labs: @LABRCNTIP (procalcitonin:4,lacticidven:4) )No results found for this or any previous visit (from the past 240 hour(s)).   Radiological Exams on Admission: Dg Chest 2 View  Result Date: 10/13/2016 CLINICAL DATA:  56 year old male with chest pain and some shortness breath relieved by inhaler. Hypertension. Initial encounter. EXAM: CHEST  2 VIEW COMPARISON:  05/19/2016. FINDINGS: Chronic lung changes without infiltrate, congestive heart failure or pneumothorax. No plain film evidence of pulmonary malignancy. Heart size top-normal. No acute osseous abnormality. IMPRESSION: Chronic lung changes without acute abnormality noted. Electronically Signed   By: Lacy Duverney M.D.   On: 10/13/2016 10:53    EKG: Independently reviewed. NSR, TWI diffusely  Assessment/Plan Principal Problem:   Chest pain Active Problems:   Abnormal EKG   HTN (hypertension)   Hyperlipidemia    Chest Pain -With TWI on EKG and a HEART score of 4, agree with admission for rule out and cardiology consultation for potential inpatient stress testing at the discretion of cardiology. Will keep NPO after midnight in case stress testing is to be pursued. -Continue ASA. -Has no known h/o CAD. -Was a prior cocaine user (states he last used May  30th 2017). Check UDS.  HTN -Currently well controlled. -Continue norvasc.  Hyperlipidemia -Check lipids. -Continue atorvastatin at current dose for now.    DVT prophylaxis: lovenox  Code Status: full code  Family Communication: patient only  Disposition Plan: hope for DC home in am  Consults called: cardiology  Admission status: observation    Time Spent: 75 minutes  Chaya Jan MD [facility] Pager [phone]  If 7PM-7AM, please contact  night-coverage [email or web address] Password [email or web address]  10/13/2016, 5:05 PM

## 2016-10-14 ENCOUNTER — Observation Stay (HOSPITAL_BASED_OUTPATIENT_CLINIC_OR_DEPARTMENT_OTHER): Payer: Medicare Other

## 2016-10-14 ENCOUNTER — Encounter (HOSPITAL_COMMUNITY): Payer: Self-pay

## 2016-10-14 DIAGNOSIS — R079 Chest pain, unspecified: Secondary | ICD-10-CM | POA: Diagnosis not present

## 2016-10-14 DIAGNOSIS — I10 Essential (primary) hypertension: Secondary | ICD-10-CM

## 2016-10-14 DIAGNOSIS — R9431 Abnormal electrocardiogram [ECG] [EKG]: Secondary | ICD-10-CM | POA: Diagnosis not present

## 2016-10-14 DIAGNOSIS — R072 Precordial pain: Secondary | ICD-10-CM

## 2016-10-14 DIAGNOSIS — E785 Hyperlipidemia, unspecified: Secondary | ICD-10-CM

## 2016-10-14 LAB — ECHOCARDIOGRAM COMPLETE
Height: 66 in
Weight: 2560 oz

## 2016-10-14 LAB — LIPID PANEL
Cholesterol: 121 mg/dL (ref 0–200)
HDL: 29 mg/dL — ABNORMAL LOW (ref >40)
LDL Cholesterol: 38 mg/dL (ref 0–99)
Total CHOL/HDL Ratio: 4.2 RATIO
Triglycerides: 271 mg/dL — ABNORMAL HIGH (ref <150)
VLDL: 54 mg/dL — ABNORMAL HIGH (ref 0–40)

## 2016-10-14 LAB — NM MYOCAR MULTI W/SPECT W/WALL MOTION / EF
Estimated workload: 11.1 METS
Exercise duration (min): 9 min
Exercise duration (sec): 19 s
LV dias vol: 87 mL (ref 62–150)
LV sys vol: 41 mL
MPHR: 162 {beats}/min
Peak HR: 162 {beats}/min
Percent HR: 100 %
RATE: 0.32
RPE: 13
Rest HR: 56 {beats}/min
SDS: 2
SRS: 0
SSS: 2
TID: 1.04

## 2016-10-14 LAB — HIV ANTIBODY (ROUTINE TESTING W REFLEX): HIV Screen 4th Generation wRfx: NONREACTIVE

## 2016-10-14 MED ORDER — REGADENOSON 0.4 MG/5ML IV SOLN
INTRAVENOUS | Status: AC
  Filled 2016-10-14: qty 5

## 2016-10-14 MED ORDER — TECHNETIUM TC 99M TETROFOSMIN IV KIT
10.0000 | PACK | Freq: Once | INTRAVENOUS | Status: AC | PRN
  Administered 2016-10-14: 9.5 via INTRAVENOUS

## 2016-10-14 MED ORDER — TECHNETIUM TC 99M TETROFOSMIN IV KIT
30.0000 | PACK | Freq: Once | INTRAVENOUS | Status: AC | PRN
  Administered 2016-10-14: 30 via INTRAVENOUS

## 2016-10-14 MED ORDER — SODIUM CHLORIDE 0.9% FLUSH
INTRAVENOUS | Status: AC
  Administered 2016-10-14: 10 mL via INTRAVENOUS
  Filled 2016-10-14: qty 10

## 2016-10-14 NOTE — Care Management Note (Signed)
Case Management Note  Patient Details  Name: Chad Frank MRN: [id number] Date of Birth: Jun 30, 1960  Subjective/Objective:      Adm with CP. Ind PTA. [place]. Reports next appt is June 8th.              Action/Plan: Stress test and ECHO today. No CM needs anticipated.   Expected Discharge Date:    10/14/2016              Expected Discharge Plan:  Home/Self Care  In-House Referral:     Discharge planning Services  CM Consult  Post Acute Care Choice:    Choice offered to:  NA  DME Arranged:    DME Agency:     HH Arranged:    HH Agency:     Status of Service:  Completed, signed off  If discussed at [employer] Length of Stay Meetings, dates discussed:    Additional Comments:  Macrina Lehnert, Chrystine OilerSharley Diane, RN 10/14/2016, 10:27 AM

## 2016-10-14 NOTE — Progress Notes (Signed)
Echo normal LVEF, no WMAs. Low risk stress test. No further cardiac workup planned at this time. We will arrange outpatient f/u in 2-3 weeks. We will sign off inpatient care   Dominga FerryJ Deshayla Empson MD

## 2016-10-14 NOTE — Progress Notes (Signed)
*  PRELIMINARY RESULTS* Echocardiogram 2D Echocardiogram has been performed.  Jeryl Columbialliott, Edder Bellanca 10/14/2016, 1:55 PM

## 2016-10-14 NOTE — Discharge Summary (Signed)
Arling Cerone, is a 56 y.o. male  DOB 1960-08-16  MRN [id number].  Admission date:  10/13/2016  Admitting Physician  Henderson Cloud, MD  Discharge Date:  10/14/2016   Primary MD  Patient, No Pcp Per  Recommendations for primary care physician for things to follow:  - please check CBC, CMP , patient will be followed by cardiology   Admission Diagnosis  Chest pain at rest [R07.9]   Discharge Diagnosis  Chest pain at rest [R07.9]   Principal Problem:   Chest pain Active Problems:   Abnormal EKG   HTN (hypertension)   Hyperlipidemia      Past Medical History:  Diagnosis Date  . Hypertension   . Irregular heart rhythm     History reviewed. No pertinent surgical history.     History of present illness and  Hospital Course:     Kindly see H&P for history of present illness and admission details, please review complete Labs, Consult reports and Test reports for all details in brief  HPI  from the history and physical done on the day of admission 10/13/2016  HPI: Raynald Rouillard is a 56 y.o. male with h/o HTN and hyperlipidemia who was awoken today at around 4:30 am from sleep with SSCP, no chest pain. Improved with nitro given in ED. VSS, he was found to have inferolateral t wave inversions on EKG with a HEART score of 4 and admission was requested. At the time of my evaluation he does not have CP.  Hospital Course   Chest pain - Given abnormal EKG with lateral ST depressions and T-wave inversion, he was admitted for further workup, monitored overnight with no significant events on telemetry, negative cardiac enzymes, cardiology input appreciated, giving his new EKG changes, urinary stress test was performed, which was overall low risk stress test, as well 2-D echo was performed, with a preserved EF, no wall motion abnormalities, so recommendation is to continue with medical management with  aspirin and statin, and cardiology to follow as an outpatient in 2-3 weeks, plan was discussed with Dr. Wyline Mood from cardiology.  HTN - BP acceptable , cont with norvasc  Hyperlipedemia - LDL is 38, continue with current dose of atorvastatin  Discharge Condition:  Stable   Follow UP  Follow-up Information    Branch, Dorothe Pea, MD Follow up.   Specialty:  Cardiology Why:  you will be called with an appoitment Contact information: [address] [place] [address] [phone]             Discharge Instructions  and  Discharge Medications     Discharge Instructions    Discharge instructions    Complete by:  As directed    Follow with Primary MD  in 7 days   Get CBC, CMP, checked  by Primary MD next visit.    Activity: As tolerated with Full fall precautions use walker/cane & assistance as needed   Disposition Home    Diet: Heart Healthy , with feeding assistance and aspiration  precautions.   On your next visit with your primary care physician please Get Medicines reviewed and adjusted.   Please request your Prim.MD to go over all Hospital Tests and Procedure/Radiological results at the follow up, please get all Hospital records sent to your Prim MD by signing hospital release before you go home.   If you experience worsening of your admission symptoms, develop shortness of breath, life threatening emergency, suicidal or homicidal thoughts you must seek medical attention immediately by calling 911 or calling your MD immediately  if symptoms less severe.  You Must read complete instructions/literature along with all the possible adverse reactions/side effects for all the Medicines you take and that have been prescribed to you. Take any new Medicines after you have completely understood and accpet all the possible adverse reactions/side effects.   Do not drive, operating heavy machinery, perform activities at heights, swimming or participation in water  activities or provide baby sitting services if your were admitted for syncope or siezures until you have seen by Primary MD or a Neurologist and advised to do so again.  Do not drive when taking Pain medications.    Do not take more than prescribed Pain, Sleep and Anxiety Medications  Special Instructions: If you have smoked or chewed Tobacco  in the last 2 yrs please stop smoking, stop any regular Alcohol  and or any Recreational drug use.  Wear Seat belts while driving.   Please note  You were cared for by a hospitalist during your hospital stay. If you have any questions about your discharge medications or the care you received while you were in the hospital after you are discharged, you can call the unit and asked to speak with the hospitalist on call if the hospitalist that took care of you is not available. Once you are discharged, your primary care physician will handle any further medical issues. Please note that NO REFILLS for any discharge medications will be authorized once you are discharged, as it is imperative that you return to your primary care physician (or establish a relationship with a primary care physician if you do not have one) for your aftercare needs so that they can reassess your need for medications and monitor your lab values.   Increase activity slowly    Complete by:  As directed      Allergies as of 10/14/2016   No Known Allergies     Medication List    TAKE these medications   acetaminophen 325 MG tablet Commonly known as:  TYLENOL Take 325 mg by mouth every 6 (six) hours as needed for mild pain or headache.   albuterol 108 (90 Base) MCG/ACT inhaler Commonly known as:  PROVENTIL HFA;VENTOLIN HFA Inhale 2 puffs into the lungs 2 (two) times daily.   amLODipine 5 MG tablet Commonly known as:  NORVASC Take 5 mg by mouth daily.   aspirin EC 81 MG tablet Take 81 mg by mouth daily as needed.   atorvastatin 40 MG tablet Commonly known as:   LIPITOR Take 40 mg by mouth daily.   FLUoxetine 20 MG capsule Commonly known as:  PROZAC Take 20 mg by mouth daily.   traZODone 100 MG tablet Commonly known as:  DESYREL Take 100 mg by mouth at bedtime.         Diet and Activity recommendation: See Discharge Instructions above   Consults obtained -  cardiology   Major procedures and Radiology Reports - PLEASE review detailed and final reports for all  details, in brief -   Nuclear stess test   Dg Chest 2 View  Result Date: 10/13/2016 CLINICAL DATA:  56 year old male with chest pain and some shortness breath relieved by inhaler. Hypertension. Initial encounter. EXAM: CHEST  2 VIEW COMPARISON:  05/19/2016. FINDINGS: Chronic lung changes without infiltrate, congestive heart failure or pneumothorax. No plain film evidence of pulmonary malignancy. Heart size top-normal. No acute osseous abnormality. IMPRESSION: Chronic lung changes without acute abnormality noted. Electronically Signed   By: Lacy DuverneySteven  Olson M.D.   On: 10/13/2016 10:53   Nm Myocar Multi W/spect W/wall Motion / Ef  Result Date: 10/14/2016  Blood pressure demonstrated a hypertensive response to exercise. Baseline ST/T changes overall unchanged with exercise  This is a low risk study. Inferior defect likely subdiaphragmatic attenuation, less likely very mild inferior ischemia. Either finding would support low risk for major cardiac events  The left ventricular ejection fraction is normal (55-65%).     Micro Results     No results found for this or any previous visit (from the past 240 hour(s)).     Today   Subjective:   Delorise RoyalsJames Alatorre today has no headache,no  abdominal pain,no new weakness tingling or numbness,He denies any further chest pain overnight feels much better wants to go home today.   Objective:   Blood pressure (!) 110/59, pulse (!) 57, temperature 98 F (36.7 C), temperature source Oral, resp. rate 15, height 5\' 6"  (1.676 m), weight 72.6 kg  (160 lb), SpO2 100 %.   Intake/Output Summary (Last 24 hours) at 10/14/16 1601 Last data filed at 10/14/16 0940  Gross per 24 hour  Intake              240 ml  Output              450 ml  Net             -210 ml    Exam Awake Alert, Oriented x 3, No new F.N deficits, Normal affect Supple Neck,No JVD Symmetrical Chest wall movement, Good air movement bilaterally, CTAB RRR,No Gallops,Rubs or new Murmurs, No Parasternal Heave +ve B.Sounds, Abd Soft, Non tender, No rebound -guarding or rigidity. No Cyanosis, Clubbing or edema, No new Rash or bruise  Data Review   CBC w Diff: Lab Results  Component Value Date   WBC 5.7 10/13/2016   HGB 12.7 (L) 10/13/2016   HCT 38.4 (L) 10/13/2016   PLT 253 10/13/2016   LYMPHOPCT 27 10/13/2016   MONOPCT 11 10/13/2016   EOSPCT 3 10/13/2016   BASOPCT 0 10/13/2016    CMP: Lab Results  Component Value Date   NA 138 10/13/2016   K 3.9 10/13/2016   CL 106 10/13/2016   CO2 26 10/13/2016   BUN 11 10/13/2016   CREATININE 1.09 10/13/2016   PROT 7.2 10/13/2016   ALBUMIN 3.7 10/13/2016   BILITOT 0.5 10/13/2016   ALKPHOS 68 10/13/2016   AST 23 10/13/2016   ALT 17 10/13/2016  .   Total Time in preparing paper work, data evaluation and todays exam - 35 minutes  Elaisha Zahniser M.D on 10/14/2016 at 4:01 PM  [facility]   Office  [phone]

## 2016-10-14 NOTE — Care Management Obs Status (Signed)
MEDICARE OBSERVATION STATUS NOTIFICATION   Patient Details  Name: Chad Frank MRN: [id number] Date of Birth: 1961/05/28   Medicare Observation Status Notification Given:  Yes    Morelia Cassells, Chrystine OilerSharley Diane, RN 10/14/2016, 10:16 AM

## 2016-10-14 NOTE — Discharge Instructions (Signed)
Follow with Primary MD  in 7 days   Get CBC, CMP,  checked  by Primary MD next visit.    Activity: As tolerated with Full fall precautions use walker/cane & assistance as needed   Disposition Home    Diet: Heart Healthy , with feeding assistance and aspiration precautions.  On your next visit with your primary care physician please Get Medicines reviewed and adjusted.   Please request your Prim.MD to go over all Hospital Tests and Procedure/Radiological results at the follow up, please get all Hospital records sent to your Prim MD by signing hospital release before you go home.   If you experience worsening of your admission symptoms, develop shortness of breath, life threatening emergency, suicidal or homicidal thoughts you must seek medical attention immediately by calling 911 or calling your MD immediately  if symptoms less severe.  You Must read complete instructions/literature along with all the possible adverse reactions/side effects for all the Medicines you take and that have been prescribed to you. Take any new Medicines after you have completely understood and accpet all the possible adverse reactions/side effects.   Do not drive, operating heavy machinery, perform activities at heights, swimming or participation in water activities or provide baby sitting services if your were admitted for syncope or siezures until you have seen by Primary MD or a Neurologist and advised to do so again.  Do not drive when taking Pain medications.    Do not take more than prescribed Pain, Sleep and Anxiety Medications  Special Instructions: If you have smoked or chewed Tobacco  in the last 2 yrs please stop smoking, stop any regular Alcohol  and or any Recreational drug use.  Wear Seat belts while driving.   Please note  You were cared for by a hospitalist during your hospital stay. If you have any questions about your discharge medications or the care you received while you were in the  hospital after you are discharged, you can call the unit and asked to speak with the hospitalist on call if the hospitalist that took care of you is not available. Once you are discharged, your primary care physician will handle any further medical issues. Please note that NO REFILLS for any discharge medications will be authorized once you are discharged, as it is imperative that you return to your primary care physician (or establish a relationship with a primary care physician if you do not have one) for your aftercare needs so that they can reassess your need for medications and monitor your lab values.  

## 2016-10-14 NOTE — Progress Notes (Signed)
Pt discharged home today per Dr. Randol KernElgergawy.  Pt's IV site D/C'd and WDL.  Pt's VSS.  Pt provided with home medication list, discharge instructions and prescriptions.  Verbalized understanding.  Pt ambulated off floor in stable condition accompanied by NT.

## 2016-10-14 NOTE — Consult Note (Signed)
Primary cardiologist:n/a Consulting cardiologist:Dr Chad Frank Requesting physician: Dr Randol Kern Indication: chest pain  Clinical Summary Chad Frank is a 56 y.o.male history of HTN, HL admitted with chest pain. Awoke him from sleep at 630AM. 6-7/10 in severity, lasted about 2 minutes. +SOB. Following the episode felt drained and fatigued for the rest of the morning. Denies any curernt symptoms.     ER vitals: p 65 bp 125/71 98% RA K 3.9 Cr 1.09 Hgb 12.7 Plt 253  UDS neg  Trop neg x 4 Echo pending EKG SR, J-point elevation/early repolarization. LVH.  Lateral ST depression T-wave inversion probable LVH strain though changes are new.    No Known Allergies  Medications Scheduled Medications: . amLODipine  5 mg Oral Daily  . aspirin EC  81 mg Oral Daily  . atorvastatin  40 mg Oral q1800  . enoxaparin (LOVENOX) injection  40 mg Subcutaneous Q24H  . FLUoxetine  20 mg Oral Daily  . traZODone  100 mg Oral QHS     Infusions:   PRN Medications:  acetaminophen, gi cocktail, ondansetron (ZOFRAN) IV   Past Medical History:  Diagnosis Date  . Hypertension   . Irregular heart rhythm     History reviewed. No pertinent surgical history.  History reviewed. No pertinent family history.  Social History Chad Frank reports that he has been smoking Cigarettes.  He has been smoking about 0.50 packs per day. He has never used smokeless tobacco. Chad Frank reports that he does not drink alcohol.  Review of Systems CONSTITUTIONAL: No weight loss, fever, chills, weakness or fatigue.  HEENT: Eyes: No visual loss, blurred vision, double vision or yellow sclerae. No hearing loss, sneezing, congestion, runny nose or sore throat.  SKIN: No rash or itching.  CARDIOVASCULAR: per hpi RESPIRATORY: per hpi GASTROINTESTINAL: No anorexia, nausea, vomiting or diarrhea. No abdominal pain or blood.  GENITOURINARY: no polyuria, no dysuria NEUROLOGICAL: No headache, dizziness, syncope,  paralysis, ataxia, numbness or tingling in the extremities. No change in bowel or bladder control.  MUSCULOSKELETAL: No muscle, back pain, joint pain or stiffness.  HEMATOLOGIC: No anemia, bleeding or bruising.  LYMPHATICS: No enlarged nodes. No history of splenectomy.  PSYCHIATRIC: No history of depression or anxiety.      Physical Examination Blood pressure (!) 110/59, pulse (!) 57, temperature 98 F (36.7 C), temperature source Oral, resp. rate 15, height 5\' 6"  (1.676 m), weight 160 lb (72.6 kg), SpO2 100 %.  Intake/Output Summary (Last 24 hours) at 10/14/16 0859 Last data filed at 10/14/16 0509  Gross per 24 hour  Intake              240 ml  Output              200 ml  Net               40 ml    HEENT: sclera clear, throat clear  Cardiovascular: RRR, no m/r/g, no jvd  Respiratory: CTAB  GI: abdomen soft, NT, ND  MSK: no LE edema  Neuro: no focal deficits  Psych: appropriate affect   Lab Results  Basic Metabolic Panel:  Recent Labs Lab 10/13/16 1020  NA 138  K 3.9  CL 106  CO2 26  GLUCOSE 116*  BUN 11  CREATININE 1.09  CALCIUM 8.8*    Liver Function Tests:  Recent Labs Lab 10/13/16 1020  AST 23  ALT 17  ALKPHOS 68  BILITOT 0.5  PROT 7.2  ALBUMIN 3.7  CBC:  Recent Labs Lab 10/13/16 1020  WBC 5.7  NEUTROABS 3.4  HGB 12.7*  HCT 38.4*  MCV 98.0  PLT 253    Cardiac Enzymes:  Recent Labs Lab 10/13/16 1020 10/13/16 1705 10/13/16 1935 10/13/16 2253  TROPONINI <0.03 <0.03 <0.03 <0.03    BNP: Invalid input(s): POCBNP   Impression/Recommendations 1. Chest pain - enzymes negative. EKG with lateral ST depressions and T-wave inversions probably related to LVH, however findings are new compared 05/20/16 EKG and cannot exclude ischemia - we will plan for exercise nuclear stress today as well as echocardiogram   Chad RichJonathan Keilana Frank, M.D.

## 2016-10-26 ENCOUNTER — Encounter: Payer: Self-pay | Admitting: Cardiology

## 2016-10-26 ENCOUNTER — Ambulatory Visit: Payer: Medicare Other | Admitting: Cardiology

## 2016-10-29 DIAGNOSIS — F329 Major depressive disorder, single episode, unspecified: Secondary | ICD-10-CM | POA: Diagnosis not present

## 2016-11-11 DIAGNOSIS — F329 Major depressive disorder, single episode, unspecified: Secondary | ICD-10-CM | POA: Diagnosis not present

## 2017-01-06 DIAGNOSIS — F329 Major depressive disorder, single episode, unspecified: Secondary | ICD-10-CM | POA: Diagnosis not present

## 2017-01-27 DIAGNOSIS — F3181 Bipolar II disorder: Secondary | ICD-10-CM | POA: Diagnosis not present

## 2017-02-24 DIAGNOSIS — Z8659 Personal history of other mental and behavioral disorders: Secondary | ICD-10-CM | POA: Diagnosis not present

## 2017-02-24 DIAGNOSIS — Z8709 Personal history of other diseases of the respiratory system: Secondary | ICD-10-CM | POA: Diagnosis not present

## 2017-02-24 DIAGNOSIS — Z862 Personal history of diseases of the blood and blood-forming organs and certain disorders involving the immune mechanism: Secondary | ICD-10-CM | POA: Diagnosis not present

## 2017-02-24 DIAGNOSIS — L84 Corns and callosities: Secondary | ICD-10-CM | POA: Diagnosis not present

## 2017-02-24 DIAGNOSIS — M79676 Pain in unspecified toe(s): Secondary | ICD-10-CM | POA: Diagnosis not present

## 2017-03-01 DIAGNOSIS — L84 Corns and callosities: Secondary | ICD-10-CM | POA: Diagnosis not present

## 2017-03-01 DIAGNOSIS — M205X2 Other deformities of toe(s) (acquired), left foot: Secondary | ICD-10-CM | POA: Diagnosis not present

## 2017-03-01 DIAGNOSIS — M79675 Pain in left toe(s): Secondary | ICD-10-CM | POA: Diagnosis not present

## 2017-03-24 DIAGNOSIS — R45851 Suicidal ideations: Secondary | ICD-10-CM | POA: Diagnosis not present

## 2017-03-24 DIAGNOSIS — R51 Headache: Secondary | ICD-10-CM | POA: Diagnosis not present

## 2017-03-25 DIAGNOSIS — F314 Bipolar disorder, current episode depressed, severe, without psychotic features: Secondary | ICD-10-CM | POA: Diagnosis not present

## 2017-03-26 DIAGNOSIS — F314 Bipolar disorder, current episode depressed, severe, without psychotic features: Secondary | ICD-10-CM | POA: Diagnosis not present

## 2017-03-27 DIAGNOSIS — F314 Bipolar disorder, current episode depressed, severe, without psychotic features: Secondary | ICD-10-CM | POA: Diagnosis not present

## 2017-03-28 DIAGNOSIS — F314 Bipolar disorder, current episode depressed, severe, without psychotic features: Secondary | ICD-10-CM | POA: Diagnosis not present

## 2017-03-29 DIAGNOSIS — F314 Bipolar disorder, current episode depressed, severe, without psychotic features: Secondary | ICD-10-CM | POA: Diagnosis not present

## 2017-03-30 DIAGNOSIS — F314 Bipolar disorder, current episode depressed, severe, without psychotic features: Secondary | ICD-10-CM | POA: Diagnosis not present

## 2017-03-31 DIAGNOSIS — F314 Bipolar disorder, current episode depressed, severe, without psychotic features: Secondary | ICD-10-CM | POA: Diagnosis not present

## 2017-04-01 DIAGNOSIS — F314 Bipolar disorder, current episode depressed, severe, without psychotic features: Secondary | ICD-10-CM | POA: Diagnosis not present

## 2017-04-02 DIAGNOSIS — F314 Bipolar disorder, current episode depressed, severe, without psychotic features: Secondary | ICD-10-CM | POA: Diagnosis not present

## 2017-04-07 DIAGNOSIS — J45909 Unspecified asthma, uncomplicated: Secondary | ICD-10-CM | POA: Diagnosis not present

## 2017-04-07 DIAGNOSIS — E785 Hyperlipidemia, unspecified: Secondary | ICD-10-CM | POA: Diagnosis not present

## 2017-04-07 DIAGNOSIS — I10 Essential (primary) hypertension: Secondary | ICD-10-CM | POA: Diagnosis not present

## 2017-04-07 DIAGNOSIS — L309 Dermatitis, unspecified: Secondary | ICD-10-CM | POA: Diagnosis not present

## 2017-04-12 DIAGNOSIS — K219 Gastro-esophageal reflux disease without esophagitis: Secondary | ICD-10-CM | POA: Diagnosis not present

## 2017-04-12 DIAGNOSIS — K2970 Gastritis, unspecified, without bleeding: Secondary | ICD-10-CM | POA: Diagnosis not present

## 2017-04-19 DIAGNOSIS — M25569 Pain in unspecified knee: Secondary | ICD-10-CM | POA: Diagnosis not present

## 2017-04-28 DIAGNOSIS — R079 Chest pain, unspecified: Secondary | ICD-10-CM | POA: Diagnosis not present

## 2017-08-18 DIAGNOSIS — R05 Cough: Secondary | ICD-10-CM | POA: Diagnosis not present

## 2017-08-18 DIAGNOSIS — Z20828 Contact with and (suspected) exposure to other viral communicable diseases: Secondary | ICD-10-CM | POA: Diagnosis not present

## 2017-08-18 DIAGNOSIS — I10 Essential (primary) hypertension: Secondary | ICD-10-CM | POA: Diagnosis not present

## 2017-08-18 DIAGNOSIS — Z205 Contact with and (suspected) exposure to viral hepatitis: Secondary | ICD-10-CM | POA: Diagnosis not present

## 2017-09-07 DIAGNOSIS — F329 Major depressive disorder, single episode, unspecified: Secondary | ICD-10-CM | POA: Diagnosis not present

## 2017-09-18 ENCOUNTER — Emergency Department (HOSPITAL_COMMUNITY)
Admission: EM | Admit: 2017-09-18 | Discharge: 2017-09-18 | Disposition: A | Discharge status: Home / Self Care | Payer: Medicare Other | Attending: Emergency Medicine | Admitting: Emergency Medicine

## 2017-09-18 ENCOUNTER — Other Ambulatory Visit: Payer: Self-pay

## 2017-09-18 ENCOUNTER — Encounter (HOSPITAL_COMMUNITY): Payer: Self-pay | Admitting: Emergency Medicine

## 2017-09-18 ENCOUNTER — Emergency Department (HOSPITAL_COMMUNITY): Payer: Medicare Other

## 2017-09-18 DIAGNOSIS — I10 Essential (primary) hypertension: Secondary | ICD-10-CM | POA: Insufficient documentation

## 2017-09-18 DIAGNOSIS — Z79899 Other long term (current) drug therapy: Secondary | ICD-10-CM | POA: Insufficient documentation

## 2017-09-18 DIAGNOSIS — G8929 Other chronic pain: Secondary | ICD-10-CM | POA: Insufficient documentation

## 2017-09-18 DIAGNOSIS — R109 Unspecified abdominal pain: Secondary | ICD-10-CM | POA: Insufficient documentation

## 2017-09-18 DIAGNOSIS — K439 Ventral hernia without obstruction or gangrene: Secondary | ICD-10-CM | POA: Diagnosis not present

## 2017-09-18 DIAGNOSIS — F17210 Nicotine dependence, cigarettes, uncomplicated: Secondary | ICD-10-CM | POA: Insufficient documentation

## 2017-09-18 HISTORY — DX: Other psychoactive substance abuse, uncomplicated: F19.10

## 2017-09-18 LAB — COMPREHENSIVE METABOLIC PANEL
ALT: 28 U/L (ref 17–63)
AST: 29 U/L (ref 15–41)
Albumin: 3.7 g/dL (ref 3.5–5.0)
Alkaline Phosphatase: 69 U/L (ref 38–126)
Anion gap: 10 (ref 5–15)
BUN: 10 mg/dL (ref 6–20)
CO2: 26 mmol/L (ref 22–32)
Calcium: 9.3 mg/dL (ref 8.9–10.3)
Chloride: 104 mmol/L (ref 101–111)
Creatinine, Ser: 1.15 mg/dL (ref 0.61–1.24)
GFR calc Af Amer: >60 mL/min (ref >60)
GFR calc non Af Amer: >60 mL/min (ref >60)
Glucose, Bld: 117 mg/dL — ABNORMAL HIGH (ref 65–99)
Potassium: 3.8 mmol/L (ref 3.5–5.1)
Sodium: 140 mmol/L (ref 135–145)
Total Bilirubin: 0.5 mg/dL (ref 0.3–1.2)
Total Protein: 7.3 g/dL (ref 6.5–8.1)

## 2017-09-18 LAB — LIPASE, BLOOD: Lipase: 43 U/L (ref 11–51)

## 2017-09-18 LAB — URINALYSIS, ROUTINE W REFLEX MICROSCOPIC
Bilirubin Urine: NEGATIVE
Glucose, UA: NEGATIVE mg/dL
Hgb urine dipstick: NEGATIVE
Ketones, ur: NEGATIVE mg/dL
Leukocytes, UA: NEGATIVE
Nitrite: NEGATIVE
Protein, ur: NEGATIVE mg/dL
Specific Gravity, Urine: 1.013 (ref 1.005–1.030)
pH: 6 (ref 5.0–8.0)

## 2017-09-18 LAB — CBC WITH DIFFERENTIAL/PLATELET
Basophils Absolute: 0 10*3/uL (ref 0.0–0.1)
Basophils Relative: 1 %
Eosinophils Absolute: 0.3 10*3/uL (ref 0.0–0.7)
Eosinophils Relative: 5 %
HCT: 39.6 % (ref 39.0–52.0)
Hemoglobin: 12.2 g/dL — ABNORMAL LOW (ref 13.0–17.0)
Lymphocytes Relative: 37 %
Lymphs Abs: 2.1 10*3/uL (ref 0.7–4.0)
MCH: 30.8 pg (ref 26.0–34.0)
MCHC: 30.8 g/dL (ref 30.0–36.0)
MCV: 100 fL (ref 78.0–100.0)
Monocytes Absolute: 0.5 10*3/uL (ref 0.1–1.0)
Monocytes Relative: 9 %
Neutro Abs: 2.8 10*3/uL (ref 1.7–7.7)
Neutrophils Relative %: 48 %
Platelets: 216 10*3/uL (ref 150–400)
RBC: 3.96 MIL/uL — ABNORMAL LOW (ref 4.22–5.81)
RDW: 13.3 % (ref 11.5–15.5)
WBC: 5.7 10*3/uL (ref 4.0–10.5)

## 2017-09-18 LAB — TROPONIN I: Troponin I: <0.03 ng/mL (ref <0.03)

## 2017-09-18 MED ORDER — FAMOTIDINE 20 MG PO TABS: 20.0000 mg | ORAL_TABLET | Freq: Two times a day (BID) | ORAL | 0 refills | Status: DC

## 2017-09-18 MED ORDER — FAMOTIDINE IN NACL 20-0.9 MG/50ML-% IV SOLN
20.0000 mg | Freq: Once | INTRAVENOUS | Status: AC
  Administered 2017-09-18: 20 mg via INTRAVENOUS
  Filled 2017-09-18: qty 50

## 2017-09-18 MED ORDER — DICYCLOMINE HCL 10 MG/ML IM SOLN
20.0000 mg | Freq: Once | INTRAMUSCULAR | Status: AC
  Administered 2017-09-18: 20 mg via INTRAMUSCULAR
  Filled 2017-09-18: qty 2

## 2017-09-18 MED ORDER — IOPAMIDOL (ISOVUE-300) INJECTION 61%
100.0000 mL | Freq: Once | INTRAVENOUS | Status: AC | PRN
  Administered 2017-09-18: 100 mL via INTRAVENOUS

## 2017-09-18 MED ORDER — DICYCLOMINE HCL 20 MG PO TABS: 20.0000 mg | ORAL_TABLET | Freq: Four times a day (QID) | ORAL | 0 refills | Status: DC | PRN

## 2017-09-18 NOTE — ED Provider Notes (Signed)
[facility] EMERGENCY DEPARTMENT Provider Note   CSN: [id number] Arrival date & time: 09/18/17  1315     History   Chief Complaint Chief Complaint  Patient presents with  . Abdominal Pain    HPI Chad Frank is a 57 y.o. male.   Abdominal Pain     Pt was seen at 1330.  Per pt, c/o gradual onset and persistence of constant left abd "pain" for the past 3 months.  Describes the abd pain as "bloating." Pt states he was evaluated at [facility] and was told "everything was ok."  Denies N/V, no diarrhea, no fevers, no back pain, no rash, no CP/SOB, no black or blood in stools.      Past Medical History:  Diagnosis Date  . Hypertension   . Irregular heart rhythm   . Substance abuse [facility])     Patient Active Problem List   Diagnosis Date Noted  . Chest pain 10/13/2016  . Abnormal EKG 10/13/2016  . HTN (hypertension) 10/13/2016  . Hyperlipidemia 10/13/2016    History reviewed. No pertinent surgical history.      Home Medications    Prior to Admission medications   Medication Sig Start Date End Date Taking? Authorizing Provider  acetaminophen (TYLENOL) 325 MG tablet Take 325 mg by mouth every 6 (six) hours as needed for mild pain or headache.    [provider]  albuterol (PROVENTIL HFA;VENTOLIN HFA) 108 (90 Base) MCG/ACT inhaler Inhale 2 puffs into the lungs 2 (two) times daily. 09/25/16 09/25/17  [provider]  amLODipine (NORVASC) 5 MG tablet Take 5 mg by mouth daily. 09/25/16   [provider]  aspirin EC 81 MG tablet Take 81 mg by mouth daily as needed. 09/25/16   [provider]  atorvastatin (LIPITOR) 40 MG tablet Take 40 mg by mouth daily. 09/25/16   [provider]  FLUoxetine (PROZAC) 20 MG capsule Take 20 mg by mouth daily. 04/28/16   [provider]  traZODone (DESYREL) 100 MG tablet Take 100 mg by mouth at bedtime. 04/28/16   [provider]    Family History No family history on  file.  Social History Social History   Tobacco Use  . Smoking status: Current Every Day Smoker    Packs/day: 0.50    Types: Cigarettes  . Smokeless tobacco: Never Used  Substance Use Topics  . Alcohol use: No    Comment: last use was Nov 06 2015  . Drug use: No    Types: Cocaine    Comment: Nov 05 2015 was last use,      Allergies   Other   Review of Systems Review of Systems  Gastrointestinal: Positive for abdominal pain.  ROS: Statement: All systems negative except as marked or noted in the HPI; Constitutional: Negative for fever and chills. ; ; Eyes: Negative for eye pain, redness and discharge. ; ; ENMT: Negative for ear pain, hoarseness, nasal congestion, sinus pressure and sore throat. ; ; Cardiovascular: Negative for chest pain, palpitations, diaphoresis, dyspnea and peripheral edema. ; ; Respiratory: Negative for cough, wheezing and stridor. ; ; Gastrointestinal: +abd pain. Negative for nausea, vomiting, diarrhea, blood in stool, hematemesis, jaundice and rectal bleeding. . ; ; Genitourinary: Negative for dysuria, flank pain and hematuria. ; ; Genital:  No penile drainage or rash, no testicular pain or swelling, no scrotal rash or swelling. ;; Musculoskeletal: Negative for back pain and neck pain. Negative for swelling and trauma.; ; Skin: Negative for pruritus, rash, abrasions,  blisters, bruising and skin lesion.; ; Neuro: Negative for headache, lightheadedness and neck stiffness. Negative for weakness, altered level of consciousness, altered mental status, extremity weakness, paresthesias, involuntary movement, seizure and syncope.        Physical Exam Updated Vital Signs BP 119/74 (BP Location: Right Arm)   Pulse 79   Temp 98 F (36.7 C) (Oral)   Resp 18   Ht 5\' 6"  (1.676 m)   Wt 72.6 kg (160 lb)   SpO2 95%   BMI 25.82 kg/m   Physical Exam 1335: Physical examination:  Nursing notes reviewed; Vital signs and O2 SAT reviewed;  Constitutional: Well developed,  Well nourished, Well hydrated, In no acute distress; Head:  Normocephalic, atraumatic; Eyes: EOMI, PERRL, No scleral icterus; ENMT: Mouth and pharynx normal, Mucous membranes moist; Neck: Supple, Full range of motion, No lymphadenopathy; Cardiovascular: Regular rate and rhythm, No gallop; Respiratory: Breath sounds clear & equal bilaterally, No wheezes.  Speaking full sentences with ease, Normal respiratory effort/excursion; Chest: Nontender, Movement normal; Abdomen: Soft, +left sided abd tender to palp. No rebound or guarding. Nondistended, Normal bowel sounds; Genitourinary: No CVA tenderness; Extremities: Peripheral pulses normal, No tenderness, No edema, No calf edema or asymmetry.; Neuro: AA&Ox3, Major CN grossly intact.  Speech clear. No gross focal motor or sensory deficits in extremities.; Skin: Color normal, Warm, Dry.   ED Treatments / Results  Labs (all labs ordered are listed, but only abnormal results are displayed)   EKG EKG Interpretation  Date/Time:  Saturday September 18 2017 13:48:26 EDT Ventricular Rate:  71 PR Interval:    QRS Duration: 79 QT Interval:  393 QTC Calculation: 428 R Axis:   80 Text Interpretation:  Sinus rhythm Biatrial enlargement Abnormal T, consider ischemia, diffuse leads Baseline wander When compared with ECG of 10/13/2016 No significant change was found Confirmed by Samuel Jester [phone]) on 09/18/2017 1:58:27 PM   Radiology   Procedures Procedures (including critical care time)  Medications Ordered in ED Medications  famotidine (PEPCID) IVPB 20 mg premix (has no administration in time range)     Initial Impression / Assessment and Plan / ED Course  I have reviewed the triage vital signs and the nursing notes.  Pertinent labs & imaging results that were available during my care of the patient were reviewed by me and considered in my medical decision making (see chart for details).  MDM Reviewed: previous chart, nursing note and  vitals Reviewed previous: labs and ECG Interpretation: labs, ECG, x-ray and CT scan   Results for orders placed or performed during the hospital encounter of 09/18/17  Comprehensive metabolic panel  Result Value Ref Range   Sodium 140 135 - 145 mmol/L   Potassium 3.8 3.5 - 5.1 mmol/L   Chloride 104 101 - 111 mmol/L   CO2 26 22 - 32 mmol/L   Glucose, Bld 117 (H) 65 - 99 mg/dL   BUN 10 6 - 20 mg/dL   Creatinine, Ser 6.04 0.61 - 1.24 mg/dL   Calcium 9.3 8.9 - 54.0 mg/dL   Total Protein 7.3 6.5 - 8.1 g/dL   Albumin 3.7 3.5 - 5.0 g/dL   AST 29 15 - 41 U/L   ALT 28 17 - 63 U/L   Alkaline Phosphatase 69 38 - 126 U/L   Total Bilirubin 0.5 0.3 - 1.2 mg/dL   GFR calc non Af Amer >60 >60 mL/min   GFR calc Af Amer >60 >60 mL/min   Anion gap 10 5 - 15  Lipase, blood  Result Value Ref Range   Lipase 43 11 - 51 U/L  CBC with Differential  Result Value Ref Range   WBC 5.7 4.0 - 10.5 K/uL   RBC 3.96 (L) 4.22 - 5.81 MIL/uL   Hemoglobin 12.2 (L) 13.0 - 17.0 g/dL   HCT 16.1 09.6 - 04.5 %   MCV 100.0 78.0 - 100.0 fL   MCH 30.8 26.0 - 34.0 pg   MCHC 30.8 30.0 - 36.0 g/dL   RDW 40.9 81.1 - 91.4 %   Platelets 216 150 - 400 K/uL   Neutrophils Relative % 48 %   Neutro Abs 2.8 1.7 - 7.7 K/uL   Lymphocytes Relative 37 %   Lymphs Abs 2.1 0.7 - 4.0 K/uL   Monocytes Relative 9 %   Monocytes Absolute 0.5 0.1 - 1.0 K/uL   Eosinophils Relative 5 %   Eosinophils Absolute 0.3 0.0 - 0.7 K/uL   Basophils Relative 1 %   Basophils Absolute 0.0 0.0 - 0.1 K/uL  Urinalysis, Routine w reflex microscopic  Result Value Ref Range   Color, Urine YELLOW YELLOW   APPearance CLEAR CLEAR   Specific Gravity, Urine 1.013 1.005 - 1.030   pH 6.0 5.0 - 8.0   Glucose, UA NEGATIVE NEGATIVE mg/dL   Hgb urine dipstick NEGATIVE NEGATIVE   Bilirubin Urine NEGATIVE NEGATIVE   Ketones, ur NEGATIVE NEGATIVE mg/dL   Protein, ur NEGATIVE NEGATIVE mg/dL   Nitrite NEGATIVE NEGATIVE   Leukocytes, UA NEGATIVE NEGATIVE   Troponin I  Result Value Ref Range   Troponin I <0.03 <0.03 ng/mL    Ct Abdomen Pelvis W Contrast Result Date: 09/18/2017 CLINICAL DATA:  Abdominal pain and distension EXAM: CT ABDOMEN AND PELVIS WITH CONTRAST TECHNIQUE: Multidetector CT imaging of the abdomen and pelvis was performed using the standard protocol following bolus administration of intravenous contrast. CONTRAST:  ISOVUE-300 IOPAMIDOL (ISOVUE-300) INJECTION 61% COMPARISON:  None. FINDINGS: Lower chest: There is bibasilar scarring. No lung edema or consolidation in the bases. Hepatobiliary: No focal liver lesions are appreciable. Gallbladder is contracted. There is no appreciable intrahepatic biliary duct dilatation. There is prominence of the common bile duct with tapering distally. No biliary duct mass or calculus evident. Pancreas: No pancreatic mass or inflammatory focus. Spleen: No splenic lesions are evident. Adrenals/Urinary Tract: Adrenals bilaterally appear normal. There is a cyst arising from the periphery of the mid right kidney laterally measuring 1.5 x 1.0 cm. There is no appreciable hydronephrosis on either side. There is a 2 mm calculus in the medial mid right kidney. There is no evident ureteral calculus on either side. Urinary bladder is midline. Urinary bladder wall thickness is mildly increased. Stomach/Bowel: There is stool throughout the left colon with the sigmoid and rectum borderline distended with stool. No bowel wall thickening is noted on this study. There is no evident bowel obstruction. No free air or portal venous air. Stomach is borderline distended with fluid and food material. Vascular/Lymphatic: There is atherosclerotic calcification in the bifurcation and proximal common iliac arteries. There is aortic atherosclerosis along the periphery of the distal aorta. No aneurysm. Major mesenteric vessels appear patent. There is no appreciable adenopathy in the abdomen or pelvis. Reproductive: Prostate and seminal  vesicles are normal in size and contour. There are scattered prostatic calculi. Other: Appendix appears normal. There is no abscess or ascites in the abdomen or pelvis. There is a small ventral hernia containing only fat. Musculoskeletal: There are no blastic or lytic bone lesions. There is no intramuscular or  abdominal wall lesion evident. IMPRESSION: 1. There is mild distention of the rectum and sigmoid colon with stool. No bowel obstruction or bowel wall thickening. 2. Appendix appears normal. No evident abscess in the abdomen or pelvis. 3. 2 mm calculus medial mid right kidney. No ureteral calculus or hydronephrosis on either side. There are small prostatic calculi. 4. Prominence of the common hepatic duct with tapering more distally. No biliary duct mass or calculus evident on this study. 5.  Aortoiliac atherosclerosis. 6.  Small ventral hernia containing only fat. Aortic Atherosclerosis (ICD10-I70.0). Electronically Signed   By: Bretta BangWilliam  Woodruff III M.D.   On: 09/18/2017 16:13   Dg Chest 2 View Result Date: 09/18/2017 CLINICAL DATA:  Abdominal pain.  Tobacco use. EXAM: CHEST - 2 VIEW COMPARISON:  Oct 13, 2016 FINDINGS: No edema or consolidation. Heart size and pulmonary vascularity are normal. No adenopathy. No pneumothorax. No bone lesions. IMPRESSION: No edema or consolidation. Electronically Signed   By: Bretta BangWilliam  Woodruff III M.D.   On: 09/18/2017 16:23    1620:  Workup reassuring. Tx symptomatically at this time, f/u GI MD. Dx and testing d/w pt.  Questions answered.  Verb understanding, agreeable to d/c home with outpt f/u.   Final Clinical Impressions(s) / ED Diagnoses   Final diagnoses:  None    ED Discharge Orders    None       Samuel JesterMcManus, Laderius Valbuena, DO 09/22/17 1737

## 2017-09-18 NOTE — Discharge Instructions (Addendum)
Eat a bland diet, avoiding greasy, fatty, fried foods, as well as spicy and acidic foods or beverages.  Avoid eating within 2 to 3 hours before going to bed or laying down.  Also avoid teas, colas, coffee, chocolate, pepermint and spearment. Take the prescriptions as directed. May also take over the counter maalox/mylanta, as directed on packaging, as needed for discomfort. Call your regular medical doctor on Monday to schedule a follow up appointment in the next 3 days. Call the GI doctor on Monday to schedule a follow up appointment within the next week.  Return to the Emergency Department immediately if worsening.

## 2017-09-18 NOTE — ED Triage Notes (Signed)
Lt side abd pain sore and feels distended x 2months.  Reports he cant lay on that side.  Last BM yesterday, denies urinary s/s

## 2017-09-20 ENCOUNTER — Encounter: Payer: Self-pay | Admitting: Gastroenterology

## 2017-09-20 DIAGNOSIS — R5383 Other fatigue: Secondary | ICD-10-CM | POA: Diagnosis not present

## 2017-09-20 DIAGNOSIS — Z125 Encounter for screening for malignant neoplasm of prostate: Secondary | ICD-10-CM | POA: Diagnosis not present

## 2017-09-20 DIAGNOSIS — I10 Essential (primary) hypertension: Secondary | ICD-10-CM | POA: Diagnosis not present

## 2017-09-20 DIAGNOSIS — I2510 Atherosclerotic heart disease of native coronary artery without angina pectoris: Secondary | ICD-10-CM | POA: Diagnosis not present

## 2017-09-20 DIAGNOSIS — E559 Vitamin D deficiency, unspecified: Secondary | ICD-10-CM | POA: Diagnosis not present

## 2017-10-05 DIAGNOSIS — R509 Fever, unspecified: Secondary | ICD-10-CM | POA: Diagnosis not present

## 2017-10-05 DIAGNOSIS — K121 Other forms of stomatitis: Secondary | ICD-10-CM | POA: Diagnosis not present

## 2017-11-04 ENCOUNTER — Ambulatory Visit (INDEPENDENT_AMBULATORY_CARE_PROVIDER_SITE_OTHER): Payer: Medicare Other | Admitting: Nurse Practitioner

## 2017-11-04 ENCOUNTER — Encounter: Payer: Self-pay | Admitting: Nurse Practitioner

## 2017-11-04 DIAGNOSIS — K5900 Constipation, unspecified: Secondary | ICD-10-CM

## 2017-11-04 DIAGNOSIS — R1032 Left lower quadrant pain: Secondary | ICD-10-CM

## 2017-11-04 DIAGNOSIS — R109 Unspecified abdominal pain: Secondary | ICD-10-CM | POA: Insufficient documentation

## 2017-11-04 NOTE — Assessment & Plan Note (Signed)
The patient complains of left-sided to left lower quadrant abdominal pain.  He has a bowel movement about 4 times a week.  He feels like he empties completely.  However, stools are hard and occasionally has to strain.  The pain is described as a fullness, bloating, cramping.  CT in the emergency department showed retained stool subsequent mild distention of the rectum and sigmoid colon.  No obstruction or wall thickening.  States his last colonoscopy was 3 to 4 years ago and essentially normal.  We will request this report.  At this time his abdominal pain is likely due to an element of constipation, possibly worsened by recreational drug use.  I will start him on Linzess 72 mcg.  We will provide samples for 2 weeks and request a progress report in 1 to 2 weeks.  Return for follow-up in 2 months.

## 2017-11-04 NOTE — Assessment & Plan Note (Signed)
Likely constipation as further discussed below, at least contributing to his abdominal pain.  CT with retained stool and mild sigmoid and rectal distention.  I will start him on Linzess.  Return for follow-up in 2 months.

## 2017-11-04 NOTE — Progress Notes (Signed)
cc'd to pcp 

## 2017-11-04 NOTE — Progress Notes (Signed)
Primary Care Physician:  Wilson Singer, MD Primary Gastroenterologist:  Dr. Darrick Penna  Chief Complaint  Patient presents with  . Abdominal Pain    left side with swelling, hurts for about 2-3 days then goes away. unable to sit when he has pain    HPI:   Chad Frank is a 57 y.o. male who presents on referral from the emergency department for abdominal pain.  The patient was seen in the ER 09/18/2017.  ER notes reviewed.  The patient presented with gradual onset and persistence of constant left abdominal pain for 3 months, described as bloating.  States he was evaluated at [facility] and told "everything is okay."  Denies nausea and vomiting, diarrhea, fever, chest pain, black stools, hematochezia.  CMP was essentially normal, lipase normal, CBC with mild anemia and hemoglobin of 12.2 otherwise normal, troponin is normal.    CT of the abdomen and pelvis was performed which found mild distention of the rectum and sigmoid colon with stool, no bowel obstruction or wall thickening.  Renal calculus.  Normal appendix.  Prominence of the common hepatic duct with tapering distally but without biliary duct mass or calculus evident.  Small ventral hernia.  Aortoiliac atherosclerosis.  Work-up was being reassuring and the patient was treated symptomatically and recommended to follow-up with GI.  Today he states he's ok overall. Has left-sided swelling and crampy pain, lasts a day or two, and typically resolves after that. Denies GERD symptoms. Has a bowel movement about 4 times a week, no straining, has sensation of complete emptying. Stools are typically hard. Denies hematochezia, melena. Denies fever, chills, unintentional weight loss, N/V. Denies chest pain, dyspnea, dizziness, lightheadedness, syncope, near syncope. Denies any other upper or lower GI symptoms.  Colonoscopy 3-4 years ago at [facility], told everything looked good. They didn't give him a repeat timeframe.  Past Medical History:    Diagnosis Date  . Hypertension   . Irregular heart rhythm   . Substance abuse [facility])     Past Surgical History:  Procedure Laterality Date  . MANDIBLE FRACTURE SURGERY      Current Outpatient Medications  Medication Sig Dispense Refill  . acetaminophen (TYLENOL) 325 MG tablet Take 325 mg by mouth every 6 (six) hours as needed for mild pain or headache.    Marland Kitchen amLODipine (NORVASC) 5 MG tablet Take 5 mg by mouth daily.    Marland Kitchen albuterol (PROVENTIL HFA;VENTOLIN HFA) 108 (90 Base) MCG/ACT inhaler Inhale 2 puffs into the lungs 2 (two) times daily.     No current facility-administered medications for this visit.     Allergies as of 11/04/2017 - Review Complete 11/04/2017  Allergen Reaction Noted  . Other  09/18/2017    Family History  Problem Relation Age of Onset  . Colon cancer Neg Hx     Social History   Socioeconomic History  . Marital status: Divorced    Spouse name: Not on file  . Number of children: Not on file  . Years of education: Not on file  . Highest education level: Not on file  Occupational History  . Not on file  Social Needs  . Financial resource strain: Not on file  . Food insecurity:    Worry: Not on file    Inability: Not on file  . Transportation needs:    Medical: Not on file    Non-medical: Not on file  Tobacco Use  . Smoking status: Current Every Day Smoker    Packs/day: 0.50  Types: Cigarettes  . Smokeless tobacco: Never Used  Substance and Sexual Activity  . Alcohol use: Yes    Comment: 1 case every 2-3 days  . Drug use: Yes    Types: Cocaine, "Crack" cocaine    Comment: "crack" used about 09/11/17  . Sexual activity: Not on file  Lifestyle  . Physical activity:    Days per week: Not on file    Minutes per session: Not on file  . Stress: Not on file  Relationships  . Social connections:    Talks on phone: Not on file    Gets together: Not on file    Attends religious service: Not on file    Active member of club or organization:  Not on file    Attends meetings of clubs or organizations: Not on file    Relationship status: Not on file  . Intimate partner violence:    Fear of current or ex partner: Not on file    Emotionally abused: Not on file    Physically abused: Not on file    Forced sexual activity: Not on file  Other Topics Concern  . Not on file  Social History Narrative  . Not on file    Review of Systems: Complete ROS negative except as per HPI.    Physical Exam: BP 110/64   Pulse 77   Temp (!) 97 F (36.1 C) (Oral)   Ht  (1.676 m)   Wt 165 lb 3.2 oz (74.9 kg)   BMI 26.66 kg/m  General:   Alert and oriented. Pleasant and cooperative. Well-nourished and well-developed.  Eyes:  Without icterus, sclera clear and conjunctiva pink.  Ears:  Normal auditory acuity. Cardiovascular:  S1, S2 present without murmurs appreciated. Extremities without clubbing or edema. Respiratory:  Clear to auscultation bilaterally. No wheezes, rales, or rhonchi. No distress.  Gastrointestinal:  +BS, soft, non-tender and non-distended. No HSM noted. No guarding or rebound. No masses appreciated.  Rectal:  Deferred  Musculoskalatal:  Symmetrical without gross deformities. Neurologic:  Alert and oriented x4;  grossly normal neurologically. Psych:  Alert and cooperative. Normal mood and affect. Heme/Lymph/Immune: No excessive bruising noted.    11/04/2017 9:08 AM   Disclaimer: This note was dictated with voice recognition software. Similar sounding words can inadvertently be transcribed and may not be corrected upon review.

## 2017-11-04 NOTE — Patient Instructions (Signed)
1. I am giving you samples of Linzess 72 mcg. 2. Take this once a day, on an empty stomach. 3. Call us in 1 to 2 weeks and let us know if it is working. 4. As we discussed, there is a small chance she may develop diarrhea when you start taking this medicine.  It typically resolves in a few days. 5. Call us if you have any questions or concerns. 6. Follow-up in 2 months in our office.  At [facility] we value your feedback. You may receive a survey about your visit today. Please share your experience as we strive to create trusting relationships with our patients to provide genuine, compassionate, quality care.  It was great to meet you today!  I hope you have a great summer!!

## 2017-11-11 ENCOUNTER — Telehealth: Payer: Self-pay | Admitting: Gastroenterology

## 2017-11-11 DIAGNOSIS — K5900 Constipation, unspecified: Secondary | ICD-10-CM

## 2017-11-11 NOTE — Telephone Encounter (Signed)
PATIENT CALLED WITH HEALTH UPDATE, HAD BOWEL MOVEMENTS, AND FEELS BETTER, DOES HE NEED TO CONTINUE HIS MEDS?  [phone]

## 2017-11-11 NOTE — Telephone Encounter (Signed)
PT said the Linzess is working good and he feels better. Please send the Rx to pharmacy.

## 2017-11-11 NOTE — Telephone Encounter (Addendum)
For EG as this is an update not a refill.

## 2017-11-12 MED ORDER — LINACLOTIDE 72 MCG PO CAPS: 72.0000 ug | ORAL_CAPSULE | Freq: Every day | ORAL | 5 refills | Status: DC

## 2017-11-12 NOTE — Addendum Note (Signed)
Addended by: Delane GingerGILL, Lamya Lausch A on: 11/12/2017 02:05 PM   Modules accepted: Orders

## 2017-11-12 NOTE — Telephone Encounter (Signed)
Rx sent to pharmacy.  Please notify patient.

## 2017-11-15 NOTE — Telephone Encounter (Signed)
Left Vm that Rx has been sent in and to call if questions.

## 2017-11-23 DIAGNOSIS — R5383 Other fatigue: Secondary | ICD-10-CM | POA: Diagnosis not present

## 2017-11-23 DIAGNOSIS — I10 Essential (primary) hypertension: Secondary | ICD-10-CM | POA: Diagnosis not present

## 2018-01-12 IMAGING — NM NM MYOCAR MULTI W/SPECT W/WALL MOTION & EF
2 series · 12 of 12 positions shown · non-contrast
Comparison: none

[Series 1: rest · 6.51mm/px · 6 of 64 frames shown]
[frame 6/64]
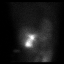
[frame 16/64]
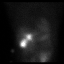
[frame 27/64]
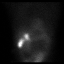
[frame 38/64]
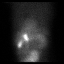
[frame 48/64]
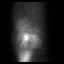
[frame 59/64]
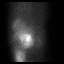

[Series 2: stress gated · 6.51mm/px · 6 of 64 frames shown]
[frame 6/64]
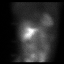
[frame 16/64]
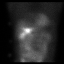
[frame 27/64]
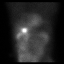
[frame 38/64]
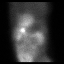
[frame 48/64]
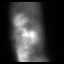
[frame 59/64]
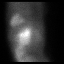

[12 of 12 positions shown; findings below may reference images not displayed]

Canned report from images found in remote index.

Refer to host system for actual result text.

## 2018-02-02 ENCOUNTER — Telehealth: Payer: Self-pay | Admitting: Gastroenterology

## 2018-02-02 ENCOUNTER — Encounter: Payer: Self-pay | Admitting: Gastroenterology

## 2018-02-02 ENCOUNTER — Ambulatory Visit: Payer: Medicare Other | Admitting: Nurse Practitioner

## 2018-02-02 NOTE — Progress Notes (Deleted)
Referring Provider: Wilson Singer, MD Primary Care Physician:  Wilson Singer, MD Primary GI:  Dr. Darrick Penna  No chief complaint on file.   HPI:   Chad Frank is a 57 y.o. male who presents for follow-up on abdominal pain and constipation.  The patient was last seen in our office 11/04/2017 for left lower quadrant abdominal pain and constipation.  Previous ER visit for the same symptoms with essentially normal labs.  CT of the abdomen found mild distention of the rectum and sigmoid colon with stool but no thickening or bowel obstruction.  At his last visit he was doing okay overall, noted left-sided swelling and crampy pain which lasts a day or 2 and typically resolves on its own.  No GERD symptoms.  Bowel movement 4 times a week, no straining but does have sensation of incomplete emptying.  Stools are typically hard.  No other GI symptoms.  Previous colonoscopy 3 to 4 years ago at [facility] and told everything was normal and no recommended repeat interval was given.  Recommended Linzess samples 72 mcg, progress report 1 to 2 weeks, follow-up in 2 months.  Patient called our office 11/11/2017 indicating Linzess helped and have better bowel movements and he was feeling better.  A prescription was sent to the pharmacy to continue Linzess.  Today he states   Past Medical History:  Diagnosis Date  . Hypertension   . Irregular heart rhythm   . Substance abuse [facility])     Past Surgical History:  Procedure Laterality Date  . MANDIBLE FRACTURE SURGERY      Current Outpatient Medications  Medication Sig Dispense Refill  . acetaminophen (TYLENOL) 325 MG tablet Take 325 mg by mouth every 6 (six) hours as needed for mild pain or headache.    . albuterol (PROVENTIL HFA;VENTOLIN HFA) 108 (90 Base) MCG/ACT inhaler Inhale 2 puffs into the lungs 2 (two) times daily.    Marland Kitchen amLODipine (NORVASC) 5 MG tablet Take 5 mg by mouth daily.    Marland Kitchen linaclotide (LINZESS) 72 MCG capsule Take 1 capsule (72 mcg  total) by mouth daily before breakfast. 30 capsule 5   No current facility-administered medications for this visit.     Allergies as of 02/02/2018 - Review Complete 11/04/2017  Allergen Reaction Noted  . Other  09/18/2017    Family History  Problem Relation Age of Onset  . Colon cancer Neg Hx     Social History   Socioeconomic History  . Marital status: Divorced    Spouse name: Not on file  . Number of children: Not on file  . Years of education: Not on file  . Highest education level: Not on file  Occupational History  . Not on file  Social Needs  . Financial resource strain: Not on file  . Food insecurity:    Worry: Not on file    Inability: Not on file  . Transportation needs:    Medical: Not on file    Non-medical: Not on file  Tobacco Use  . Smoking status: Current Every Day Smoker    Packs/day: 0.50    Types: Cigarettes  . Smokeless tobacco: Never Used  Substance and Sexual Activity  . Alcohol use: Yes    Comment: 1 case every 2-3 days  . Drug use: Yes    Types: Cocaine, "Crack" cocaine    Comment: "crack" used about 09/11/17  . Sexual activity: Not on file  Lifestyle  . Physical activity:    Days per  week: Not on file    Minutes per session: Not on file  . Stress: Not on file  Relationships  . Social connections:    Talks on phone: Not on file    Gets together: Not on file    Attends religious service: Not on file    Active member of club or organization: Not on file    Attends meetings of clubs or organizations: Not on file    Relationship status: Not on file  Other Topics Concern  . Not on file  Social History Narrative  . Not on file    Review of Systems: Complete ROS negative except as per HPI.   Physical Exam: There were no vitals taken for this visit. General:   Alert and oriented. Pleasant and cooperative. Well-nourished and well-developed.  Head:  Normocephalic and atraumatic. Eyes:  Without icterus, sclera clear and conjunctiva  pink.  Ears:  Normal auditory acuity. Mouth:  No deformity or lesions, oral mucosa pink.  Throat/Neck:  Supple, without mass or thyromegaly. Cardiovascular:  S1, S2 present without murmurs appreciated. Normal pulses noted. Extremities without clubbing or edema. Respiratory:  Clear to auscultation bilaterally. No wheezes, rales, or rhonchi. No distress.  Gastrointestinal:  +BS, soft, non-tender and non-distended. No HSM noted. No guarding or rebound. No masses appreciated.  Rectal:  Deferred  Musculoskalatal:  Symmetrical without gross deformities. Normal posture. Skin:  Intact without significant lesions or rashes. Neurologic:  Alert and oriented x4;  grossly normal neurologically. Psych:  Alert and cooperative. Normal mood and affect. Heme/Lymph/Immune: No significant cervical adenopathy. No excessive bruising noted.    02/02/2018 8:01 AM   Disclaimer: This note was dictated with voice recognition software. Similar sounding words can inadvertently be transcribed and may not be corrected upon review.

## 2018-02-02 NOTE — Telephone Encounter (Signed)
PATENT WAS A NO SHOW AND LETTER SENT  °

## 2018-02-02 NOTE — Telephone Encounter (Signed)
Noted  

## 2018-03-23 DIAGNOSIS — I517 Cardiomegaly: Secondary | ICD-10-CM | POA: Diagnosis not present

## 2018-03-23 DIAGNOSIS — R079 Chest pain, unspecified: Secondary | ICD-10-CM | POA: Diagnosis not present

## 2018-03-23 DIAGNOSIS — R0602 Shortness of breath: Secondary | ICD-10-CM | POA: Diagnosis not present

## 2018-03-23 DIAGNOSIS — R05 Cough: Secondary | ICD-10-CM | POA: Diagnosis not present

## 2018-03-23 DIAGNOSIS — F17210 Nicotine dependence, cigarettes, uncomplicated: Secondary | ICD-10-CM | POA: Diagnosis not present

## 2018-03-23 DIAGNOSIS — R072 Precordial pain: Secondary | ICD-10-CM | POA: Diagnosis not present

## 2018-03-23 DIAGNOSIS — I440 Atrioventricular block, first degree: Secondary | ICD-10-CM | POA: Diagnosis not present

## 2018-03-23 DIAGNOSIS — R9431 Abnormal electrocardiogram [ECG] [EKG]: Secondary | ICD-10-CM | POA: Diagnosis not present

## 2018-03-23 DIAGNOSIS — R0789 Other chest pain: Secondary | ICD-10-CM | POA: Diagnosis not present

## 2018-03-27 DIAGNOSIS — R05 Cough: Secondary | ICD-10-CM | POA: Diagnosis not present

## 2018-03-27 DIAGNOSIS — R9431 Abnormal electrocardiogram [ECG] [EKG]: Secondary | ICD-10-CM | POA: Diagnosis not present

## 2018-03-27 DIAGNOSIS — F17210 Nicotine dependence, cigarettes, uncomplicated: Secondary | ICD-10-CM | POA: Diagnosis not present

## 2018-03-27 DIAGNOSIS — R0602 Shortness of breath: Secondary | ICD-10-CM | POA: Diagnosis not present

## 2018-03-27 DIAGNOSIS — R0789 Other chest pain: Secondary | ICD-10-CM | POA: Diagnosis not present

## 2018-03-27 DIAGNOSIS — I440 Atrioventricular block, first degree: Secondary | ICD-10-CM | POA: Diagnosis not present

## 2018-03-28 DIAGNOSIS — R9431 Abnormal electrocardiogram [ECG] [EKG]: Secondary | ICD-10-CM | POA: Diagnosis not present

## 2018-03-28 DIAGNOSIS — I440 Atrioventricular block, first degree: Secondary | ICD-10-CM | POA: Diagnosis not present

## 2018-03-31 DIAGNOSIS — R05 Cough: Secondary | ICD-10-CM | POA: Diagnosis not present

## 2018-03-31 DIAGNOSIS — F17200 Nicotine dependence, unspecified, uncomplicated: Secondary | ICD-10-CM | POA: Diagnosis not present

## 2018-05-07 ENCOUNTER — Encounter (HOSPITAL_COMMUNITY): Payer: Self-pay | Admitting: Emergency Medicine

## 2018-05-07 ENCOUNTER — Emergency Department (HOSPITAL_COMMUNITY): Payer: Medicare Other

## 2018-05-07 ENCOUNTER — Other Ambulatory Visit: Payer: Self-pay

## 2018-05-07 ENCOUNTER — Emergency Department (HOSPITAL_COMMUNITY)
Admission: EM | Admit: 2018-05-07 | Discharge: 2018-05-07 | Disposition: A | Discharge status: Home / Self Care | Payer: Medicare Other | Attending: Emergency Medicine | Admitting: Emergency Medicine

## 2018-05-07 DIAGNOSIS — I10 Essential (primary) hypertension: Secondary | ICD-10-CM | POA: Insufficient documentation

## 2018-05-07 DIAGNOSIS — Z79899 Other long term (current) drug therapy: Secondary | ICD-10-CM | POA: Insufficient documentation

## 2018-05-07 DIAGNOSIS — M25532 Pain in left wrist: Secondary | ICD-10-CM | POA: Diagnosis not present

## 2018-05-07 DIAGNOSIS — F17210 Nicotine dependence, cigarettes, uncomplicated: Secondary | ICD-10-CM | POA: Insufficient documentation

## 2018-05-07 DIAGNOSIS — G5602 Carpal tunnel syndrome, left upper limb: Secondary | ICD-10-CM | POA: Insufficient documentation

## 2018-05-07 MED ORDER — IBUPROFEN 600 MG PO TABS: 600.0000 mg | ORAL_TABLET | Freq: Four times a day (QID) | ORAL | 0 refills | Status: DC | PRN

## 2018-05-07 MED ORDER — KETOROLAC TROMETHAMINE 30 MG/ML IJ SOLN
30.0000 mg | Freq: Once | INTRAMUSCULAR | Status: AC
  Administered 2018-05-07: 30 mg via INTRAMUSCULAR
  Filled 2018-05-07: qty 1

## 2018-05-07 MED ORDER — DEXAMETHASONE SODIUM PHOSPHATE 10 MG/ML IJ SOLN
10.0000 mg | Freq: Once | INTRAMUSCULAR | Status: AC
  Administered 2018-05-07: 10 mg via INTRAMUSCULAR
  Filled 2018-05-07: qty 1

## 2018-05-07 MED ORDER — PREDNISONE 10 MG (21) PO TBPK: ORAL_TABLET | ORAL | 0 refills | Status: DC

## 2018-05-07 NOTE — ED Triage Notes (Signed)
Radial pulse strong, capillary refill WNL.  

## 2018-05-07 NOTE — ED Triage Notes (Signed)
Patient c/o left wrist pain x3 days without injury. Per patient had "nerve removed from arm 7 years ago." Patient states occasional pain but nothing like today. Patient reports numbness in fingers and "difficulty holding his cigarette."

## 2018-05-07 NOTE — ED Provider Notes (Signed)
[facility] Provider Note   CSN: [id number] Arrival date & time: 05/07/18  1617     History   Chief Complaint Chief Complaint  Patient presents with  . Wrist Pain    HPI Chad RoyalsJames Frank is a 57 y.o. male.  Pt presents to the ED today with left wrist pain and finger numbness.  Pt said he journals a lot (he is left handed) and has noticed that it hurts to hold a pen.  He has numbness in his fingers.  He said it is hard to hold his cigarette.  Pt said he had to have his nerve cut several years ago.  He points to his elbow, so it was likely his ulnar nerve.  The pt denies f/c.  He has a hx of substance abuse, but has been clean for several years.  No IVDA.      Past Medical History:  Diagnosis Date  . Hypertension   . Irregular heart rhythm   . Substance abuse [facility](HCC)     Patient Active Problem List   Diagnosis Date Noted  . Abdominal pain 11/04/2017  . Constipation 11/04/2017  . Chest pain 10/13/2016  . Abnormal EKG 10/13/2016  . HTN (hypertension) 10/13/2016  . Hyperlipidemia 10/13/2016    Past Surgical History:  Procedure Laterality Date  . MANDIBLE FRACTURE SURGERY    . NERVE SURGERY Left         Home Medications    Prior to Admission medications   Medication Sig Start Date End Date Taking? Authorizing Provider  acetaminophen (TYLENOL) 325 MG tablet Take 325 mg by mouth every 6 (six) hours as needed for mild pain or headache.    [provider]  albuterol (PROVENTIL HFA;VENTOLIN HFA) 108 (90 Base) MCG/ACT inhaler Inhale 2 puffs into the lungs 2 (two) times daily. 09/25/16 09/25/17  [provider]  amLODipine (NORVASC) 5 MG tablet Take 5 mg by mouth daily. 09/25/16   [provider]  ibuprofen (ADVIL,MOTRIN) 600 MG tablet Take 1 tablet (600 mg total) by mouth every 6 (six) hours as needed. 05/07/18   Jacalyn LefevreHaviland, Natajah Derderian, MD  linaclotide Karlene Einstein(LINZESS) 72 MCG capsule Take 1 capsule (72 mcg total) by mouth daily before breakfast.  11/12/17   Anice PaganiniGill, Eric A, NP  predniSONE (STERAPRED UNI-PAK 21 TAB) 10 MG (21) TBPK tablet Take 6 tabs for 2 days, then 5 for 2 days, then 4 for 2 days, then 3 for 2 days, 2 for 2 days, then 1 for 2 days 05/07/18   Jacalyn LefevreHaviland, Aveleen Nevers, MD    Family History Family History  Problem Relation Age of Onset  . Colon cancer Neg Hx     Social History Social History   Tobacco Use  . Smoking status: Current Every Day Smoker    Packs/day: 0.50    Types: Cigarettes  . Smokeless tobacco: Never Used  Substance Use Topics  . Alcohol use: Yes    Comment: 1 case every 2-3 days  . Drug use: Not Currently    Types: Cocaine, "Crack" cocaine    Comment: "crack" used about 09/11/17-Denies any use on 05/07/18     Allergies   Other   Review of Systems Review of Systems  Musculoskeletal:       Left wrist pain  All other systems reviewed and are negative.    Physical Exam Updated Vital Signs BP 124/82 (BP Location: Right Arm)   Pulse 85   Temp 97.9 F (36.6 C) (Oral)   Resp 18  Ht 5\' 6"  (1.676 m)   Wt 72.6 kg   SpO2 99%   BMI 25.82 kg/m   Physical Exam  Constitutional: He is oriented to person, place, and time. He appears well-developed and well-nourished.  HENT:  Head: Normocephalic and atraumatic.  Right Ear: External ear normal.  Left Ear: External ear normal.  Nose: Nose normal.  Mouth/Throat: Oropharynx is clear and moist.  Eyes: Pupils are equal, round, and reactive to light. Conjunctivae and EOM are normal.  Neck: Normal range of motion. Neck supple.  Cardiovascular: Normal rate, regular rhythm, normal heart sounds and intact distal pulses.  Pulmonary/Chest: Effort normal and breath sounds normal.  Abdominal: Soft. Bowel sounds are normal.  Musculoskeletal:       Left wrist: He exhibits tenderness.  + phalen's  + tinel's  Neurological: He is alert and oriented to person, place, and time.  Pt is able to move all his fingers, but has subjective numbness to all of his fingers.   Skin: Skin is warm. Capillary refill takes less than 2 seconds.  Psychiatric: He has a normal mood and affect.  Nursing note and vitals reviewed.    ED Treatments / Results  Labs (all labs ordered are listed, but only abnormal results are displayed) Labs Reviewed - No data to display  EKG None  Radiology Dg Wrist Complete Left  Result Date: 05/07/2018 CLINICAL DATA:  Left wrist pain for 3 days.  No known injury. EXAM: LEFT WRIST - COMPLETE 3+ VIEW COMPARISON:  None. FINDINGS: There is no evidence of fracture or dislocation. There is no evidence of arthropathy or other focal bone abnormality. Soft tissues are unremarkable. IMPRESSION: Normal exam. Electronically Signed   By: Drusilla Kanner M.D.   On: 05/07/2018 17:02    Procedures Procedures (including critical care time)  Medications Ordered in ED Medications  dexamethasone (DECADRON) injection 10 mg (10 mg Intramuscular Given 05/07/18 1858)  ketorolac (TORADOL) 30 MG/ML injection 30 mg (30 mg Intramuscular Given 05/07/18 1856)     Initial Impression / Assessment and Plan / ED Course  I have reviewed the triage vital signs and the nursing notes.  Pertinent labs & imaging results that were available during my care of the patient were reviewed by me and considered in my medical decision making (see chart for details).    I suspect pt has carpal tunnel syndrome.  He will be placed in a splint and given a dose of decadron and toradol.  He does not want narcotics due hx of substance abuse.  He knows to f/u with ortho.  Return if worse.  Final Clinical Impressions(s) / ED Diagnoses   Final diagnoses:  Carpal tunnel syndrome of left wrist    ED Discharge Orders         Ordered    ibuprofen (ADVIL,MOTRIN) 600 MG tablet  Every 6 hours PRN     05/07/18 1850    predniSONE (STERAPRED UNI-PAK 21 TAB) 10 MG (21) TBPK tablet     05/07/18 1850           Jacalyn Lefevre, MD 05/07/18 1900

## 2018-05-07 NOTE — ED Notes (Signed)
Pt reports had a nerve removed from his arm 7 years ago Has no physician and no insurance  Has had pain and numbness to his wrist for several days  Has taken no meds for pain   Reports cannot hold his cigarette

## 2018-05-10 DIAGNOSIS — I10 Essential (primary) hypertension: Secondary | ICD-10-CM | POA: Diagnosis not present

## 2018-05-10 DIAGNOSIS — J45909 Unspecified asthma, uncomplicated: Secondary | ICD-10-CM | POA: Diagnosis not present

## 2018-05-10 DIAGNOSIS — F17200 Nicotine dependence, unspecified, uncomplicated: Secondary | ICD-10-CM | POA: Diagnosis not present

## 2018-05-10 DIAGNOSIS — F319 Bipolar disorder, unspecified: Secondary | ICD-10-CM | POA: Diagnosis not present

## 2018-05-17 DIAGNOSIS — F17200 Nicotine dependence, unspecified, uncomplicated: Secondary | ICD-10-CM | POA: Diagnosis not present

## 2018-05-17 DIAGNOSIS — E785 Hyperlipidemia, unspecified: Secondary | ICD-10-CM | POA: Diagnosis not present

## 2018-05-17 DIAGNOSIS — Z1389 Encounter for screening for other disorder: Secondary | ICD-10-CM | POA: Diagnosis not present

## 2018-05-17 DIAGNOSIS — I10 Essential (primary) hypertension: Secondary | ICD-10-CM | POA: Diagnosis not present

## 2018-05-17 DIAGNOSIS — Z1331 Encounter for screening for depression: Secondary | ICD-10-CM | POA: Diagnosis not present

## 2018-05-17 DIAGNOSIS — F319 Bipolar disorder, unspecified: Secondary | ICD-10-CM | POA: Diagnosis not present

## 2018-08-16 DIAGNOSIS — I10 Essential (primary) hypertension: Secondary | ICD-10-CM | POA: Diagnosis not present

## 2018-08-16 DIAGNOSIS — F319 Bipolar disorder, unspecified: Secondary | ICD-10-CM | POA: Diagnosis not present

## 2018-08-16 DIAGNOSIS — M545 Low back pain: Secondary | ICD-10-CM | POA: Diagnosis not present

## 2018-10-05 ENCOUNTER — Emergency Department (HOSPITAL_COMMUNITY)
Admission: EM | Admit: 2018-10-05 | Discharge: 2018-10-05 | Disposition: A | Discharge status: Home / Self Care | Payer: Medicare Other | Attending: Emergency Medicine | Admitting: Emergency Medicine

## 2018-10-05 ENCOUNTER — Encounter (HOSPITAL_COMMUNITY): Payer: Self-pay

## 2018-10-05 ENCOUNTER — Other Ambulatory Visit: Payer: Self-pay

## 2018-10-05 DIAGNOSIS — F17210 Nicotine dependence, cigarettes, uncomplicated: Secondary | ICD-10-CM | POA: Insufficient documentation

## 2018-10-05 DIAGNOSIS — M792 Neuralgia and neuritis, unspecified: Secondary | ICD-10-CM | POA: Diagnosis not present

## 2018-10-05 DIAGNOSIS — M79642 Pain in left hand: Secondary | ICD-10-CM | POA: Diagnosis not present

## 2018-10-05 DIAGNOSIS — I10 Essential (primary) hypertension: Secondary | ICD-10-CM | POA: Diagnosis not present

## 2018-10-05 DIAGNOSIS — E785 Hyperlipidemia, unspecified: Secondary | ICD-10-CM | POA: Insufficient documentation

## 2018-10-05 MED ORDER — GABAPENTIN 300 MG PO CAPS: 300.0000 mg | ORAL_CAPSULE | Freq: Three times a day (TID) | ORAL | 0 refills | Status: DC

## 2018-10-05 MED ORDER — DEXAMETHASONE 4 MG PO TABS: 4.0000 mg | ORAL_TABLET | Freq: Two times a day (BID) | ORAL | 0 refills | Status: DC

## 2018-10-05 NOTE — ED Provider Notes (Signed)
[facility] EMERGENCY DEPARTMENT Provider Note   CSN: [id number] Arrival date & time: 10/05/18  0736    History   Chief Complaint Chief Complaint  Patient presents with  . Wrist Pain    HPI Chad Frank is a 58 y.o. male.   HPI  58 year old male with left hand/wrist pain.  Pain and numbness/tingling.  Acute on chronic issue.  He reports a past history of pain in his wrist and hand.  From his description, it sounds like possible carpal tunnel.  He states that he was previously advised to wear a splint that he tries to be compliant with but says that pretty irritating for him to wear it.  He is previously taken NSAIDs and Tylenol.  He is staying at a recovery house and they have a garden that they maintain.  Yesterday he was using a tiller for approximately 2 hours.  His symptoms were exacerbated after this.  He denies any acute neck pain.  Past Medical History:  Diagnosis Date  . Hypertension   . Irregular heart rhythm   . Substance abuse [facility])     Patient Active Problem List   Diagnosis Date Noted  . Abdominal pain 11/04/2017  . Constipation 11/04/2017  . Chest pain 10/13/2016  . Abnormal EKG 10/13/2016  . HTN (hypertension) 10/13/2016  . Hyperlipidemia 10/13/2016    Past Surgical History:  Procedure Laterality Date  . MANDIBLE FRACTURE SURGERY    . NERVE SURGERY Left         Home Medications    Prior to Admission medications   Medication Sig Start Date End Date Taking? Authorizing Provider  acetaminophen (TYLENOL) 325 MG tablet Take 325 mg by mouth every 6 (six) hours as needed for mild pain or headache.    [provider]  albuterol (PROVENTIL HFA;VENTOLIN HFA) 108 (90 Base) MCG/ACT inhaler Inhale 2 puffs into the lungs 2 (two) times daily. 09/25/16 09/25/17  [provider]  amLODipine (NORVASC) 5 MG tablet Take 5 mg by mouth daily. 09/25/16   [provider]  dexamethasone (DECADRON) 4 MG tablet Take 1 tablet (4 mg total) by mouth 2  (two) times daily with a meal. 10/05/18   Raeford Razor, MD  gabapentin (NEURONTIN) 300 MG capsule Take 1 capsule (300 mg total) by mouth 3 (three) times daily. 10/05/18   Raeford Razor, MD  ibuprofen (ADVIL,MOTRIN) 600 MG tablet Take 1 tablet (600 mg total) by mouth every 6 (six) hours as needed. 05/07/18   Jacalyn Lefevre, MD  linaclotide Karlene Einstein) 72 MCG capsule Take 1 capsule (72 mcg total) by mouth daily before breakfast. 11/12/17   Anice Paganini, NP  predniSONE (STERAPRED UNI-PAK 21 TAB) 10 MG (21) TBPK tablet Take 6 tabs for 2 days, then 5 for 2 days, then 4 for 2 days, then 3 for 2 days, 2 for 2 days, then 1 for 2 days 05/07/18   Jacalyn Lefevre, MD    Family History Family History  Problem Relation Age of Onset  . Colon cancer Neg Hx     Social History Social History   Tobacco Use  . Smoking status: Current Every Day Smoker    Packs/day: 0.50    Types: Cigarettes  . Smokeless tobacco: Never Used  Substance Use Topics  . Alcohol use: Not Currently    Comment: 1 case every 2-3 days  . Drug use: Not Currently    Types: Cocaine, "Crack" cocaine    Comment: "crack" used about 09/11/17-Denies any use on 05/07/18  Allergies   Other   Review of Systems Review of Systems  All systems reviewed and negative, other than as noted in HPI.  Physical Exam Updated Vital Signs BP 132/71 (BP Location: Right Arm)   Pulse 69   Temp 97.6 F (36.4 C) (Oral)   Resp 16   SpO2 100%   Physical Exam Vitals signs and nursing note reviewed.  Constitutional:      General: He is not in acute distress.    Appearance: He is well-developed.  HENT:     Head: Normocephalic and atraumatic.  Eyes:     General:        Right eye: No discharge.        Left eye: No discharge.     Conjunctiva/sclera: Conjunctivae normal.  Neck:     Musculoskeletal: Neck supple.  Cardiovascular:     Rate and Rhythm: Normal rate and regular rhythm.     Heart sounds: Normal heart sounds. No murmur. No  friction rub. No gallop.   Pulmonary:     Effort: Pulmonary effort is normal. No respiratory distress.     Breath sounds: Normal breath sounds.  Abdominal:     General: There is no distension.     Palpations: Abdomen is soft.     Tenderness: There is no abdominal tenderness.  Musculoskeletal:        General: No tenderness.  Skin:    General: Skin is warm and dry.  Neurological:     Mental Status: He is alert.     Sensory: No sensory deficit.     Motor: No weakness.  Psychiatric:        Behavior: Behavior normal.        Thought Content: Thought content normal.      ED Treatments / Results  Labs (all labs ordered are listed, but only abnormal results are displayed) Labs Reviewed - No data to display  EKG None  Radiology No results found.  Procedures Procedures (including critical care time)  Medications Ordered in ED Medications - No data to display   Initial Impression / Assessment and Plan / ED Course  I have reviewed the triage vital signs and the nursing notes.  Pertinent labs & imaging results that were available during my care of the patient were reviewed by me and considered in my medical decision making (see chart for details).        58 year old male with pain/numbness in his left wrist and hand.  His exam is nonfocal but, based on his description, it sounds like he is having neuropathic symptoms.  Will place him on gabapentin and a course of steroids.  He was provided with a note stating that he should not be using vibrating equipment such as lawnmowers, weedeater's, tillers, etc.  If he still having persistent symptoms recommended orthopedic/hand surgery follow-up.  Try to encourage continued use of the brace is the best he can.  Return precautions discussed.  Final Clinical Impressions(s) / ED Diagnoses   Final diagnoses:  Neuropathic pain of hand, left    ED Discharge Orders         Ordered    gabapentin (NEURONTIN) 300 MG capsule  3 times daily      10/05/18 0813    dexamethasone (DECADRON) 4 MG tablet  2 times daily with meals     10/05/18 0813           Raeford RazorKohut, Doyel Mulkern, MD 10/05/18 [id number]

## 2018-10-05 NOTE — ED Triage Notes (Addendum)
Pt reports seen for left wrist pain 2 months ago and had been wearing brace, but irritates wrist. Reports intermittent numb,ess and tingling last 3 fingers. Reports he used tiller yesterday and made it sore and then hit on shower this morning Pt lives at [facility] house and cannot have narcotics

## 2018-10-06 ENCOUNTER — Telehealth: Payer: Self-pay | Admitting: Orthopedic Surgery

## 2018-10-06 NOTE — Telephone Encounter (Signed)
Virtual visit next week

## 2018-10-06 NOTE — Telephone Encounter (Signed)
Patient went to [facility] (4/29) for Left hand pain. He wants to see you and I explained to him how we are doing scheduling appointments for now. He was ok with waiting for a return call with your recommendation.  Please review and advise.

## 2018-10-07 NOTE — Telephone Encounter (Signed)
I have called and left a message for patient to call the office to get appointment setup.

## 2018-10-07 NOTE — Telephone Encounter (Signed)
Patient has been scheduled for Wednesday 5/6 @ 8:40. Patient is aware of appointment.

## 2018-10-12 ENCOUNTER — Ambulatory Visit (INDEPENDENT_AMBULATORY_CARE_PROVIDER_SITE_OTHER): Payer: Medicare Other | Admitting: Orthopedic Surgery

## 2018-10-12 ENCOUNTER — Encounter: Payer: Self-pay | Admitting: Orthopedic Surgery

## 2018-10-12 ENCOUNTER — Other Ambulatory Visit: Payer: Self-pay

## 2018-10-12 ENCOUNTER — Telehealth: Payer: Self-pay | Admitting: Radiology

## 2018-10-12 DIAGNOSIS — J45909 Unspecified asthma, uncomplicated: Secondary | ICD-10-CM | POA: Diagnosis not present

## 2018-10-12 DIAGNOSIS — G5622 Lesion of ulnar nerve, left upper limb: Secondary | ICD-10-CM

## 2018-10-12 DIAGNOSIS — F319 Bipolar disorder, unspecified: Secondary | ICD-10-CM | POA: Diagnosis not present

## 2018-10-12 DIAGNOSIS — E785 Hyperlipidemia, unspecified: Secondary | ICD-10-CM | POA: Diagnosis not present

## 2018-10-12 DIAGNOSIS — M654 Radial styloid tenosynovitis [de Quervain]: Secondary | ICD-10-CM | POA: Diagnosis not present

## 2018-10-12 DIAGNOSIS — I10 Essential (primary) hypertension: Secondary | ICD-10-CM | POA: Diagnosis not present

## 2018-10-12 NOTE — Telephone Encounter (Signed)
Order placed

## 2018-10-12 NOTE — Patient Instructions (Signed)
Activity modification Wear brace as needed Ibuprofen continue Gabapentin continue  Nerve study will be ordered

## 2018-10-12 NOTE — Telephone Encounter (Signed)
-----   Message from Vickki Hearing, MD sent at 10/12/2018  8:59 AM EDT ----- I know it may take a while but this patient will need a nerve conduction study.  When I get the study back I will call him with the results and make further treatment plans

## 2018-10-12 NOTE — Progress Notes (Signed)
Virtual Visit via Telephone Note  I connected with Chad Frank on 10/12/18 at  8:40 AM EDT by telephone and verified that I am speaking with the correct person using two identifiers.  Location: Patient: home  Provider: office   I discussed the limitations, risks, security and privacy concerns of performing an evaluation and management service by telephone and the availability of in person appointments. I also discussed with the patient that there may be a patient responsible charge related to this service. The patient expressed understanding and agreed to proceed.   I discussed the assessment and treatment plan with the patient. The patient was provided an opportunity to ask questions and all were answered. The patient agreed with the plan and demonstrated an understanding of the instructions.   The patient was advised to call back or seek an in-person evaluation if the symptoms worsen or if the condition fails to improve as anticipated.  I provided 12 minutes of non-face-to-face time during this encounter.  No chief complaint on file.  58 year old male presents with left wrist pain  He is left-hand dominant half pack per day smoker he status post what sounds like an ulnar nerve transposition or release several years ago.  He describes that the nerve was removed but he also says that the hand was not numb afterwards  However over the last few weeks or so he complains of pain left thumb difficulty grabbing and gripping associated with the last 3 fingers going numb.  He has worn a brace but it causes his wrist to swell.  His symptoms increased after he was mowing and telling the yard  He complains of a throbbing irritating pain over the left wrist and thumb again associated with numbness and tingling of the long ring and small finger.  His treatment to date has included ibuprofen gabapentin and a steroid Dosepak which she has completed  [facility].  Review of  Systems  Constitutional: Negative for chills and fever.  Respiratory: Positive for cough, shortness of breath and wheezing.   Musculoskeletal: Positive for joint pain.  Skin: Negative.   Neurological: Positive for tingling, sensory change and focal weakness.   Past Medical History:  Diagnosis Date  . Hypertension   . Irregular heart rhythm   . Substance abuse [facility](HCC)    Past Surgical History:  Procedure Laterality Date  . MANDIBLE FRACTURE SURGERY    . NERVE SURGERY Left     Medical decision making  Data image interpretation.  [facility] wrist dated 05/07/2028  Left wrist 3 views  Findings normal alignment with no signs of arthritis or focal bone abnormality no soft tissue swelling  Impression normal exam  This x-ray was also read by Drusilla Kannerhomas Dalessio  CLINICAL DATA:  Left wrist pain for 3 days.  No known injury.  EXAM: LEFT WRIST - COMPLETE 3+ VIEW  COMPARISON:  None.  FINDINGS: There is no evidence of fracture or dislocation. There is no evidence of arthropathy or other focal bone abnormality. Soft tissues are unremarkable.  IMPRESSION: Normal exam.   Electronically Signed   By: Drusilla Kannerhomas  Dalessio M.D.   On: 05/07/2018 17:02  Diagnosis differential diagnosis of carpal tunnel syndrome ulnar neuropathy [facility] arthritis de Quervain's syndrome  Encounter Diagnoses  Name Primary?  Chad Frank. De Quervain's syndrome (tenosynovitis) Yes  . Ulnar neuropathy of left upper extremity      Plan nerve conduction study left upper extremity  In the interim  Continue  bracing Continue ibuprofen Continue rest and activity modification Continue gabapentin  Fuller Canada, MD

## 2018-10-14 ENCOUNTER — Telehealth: Payer: Self-pay | Admitting: Orthopedic Surgery

## 2018-10-14 NOTE — Telephone Encounter (Signed)
Patient called, said "Miss Chad Frank" relayed that he may be able to see a specialist in [place] for his nerve conduction study. I relayed, per Amy - Dr Gerilyn Pilgrim, to whom typically do not refer. Please advise.  Patient resides at [facility]. His ph# [phone]

## 2018-10-18 NOTE — Telephone Encounter (Signed)
I have advised him about appointment taking months to get scheduled, he has decided to make the appointment in [place], he said he wants sooner, does not want to wait longer.

## 2018-10-18 NOTE — Telephone Encounter (Signed)
Patient called again, and asks for a call about being referred to a doctor here in [place], vs. [place]. Please call him.

## 2018-10-18 NOTE — Telephone Encounter (Signed)
Called him he can make appointment with Dr Gerilyn Pilgrim if he wants to wait several months.  Left message for him to call back I will send notes/ referral  but he has to call and schedule, it normally takes 6 months for nerve study to be scheduled and done and report received. If he has in [place] turn around time is a couple weeks.

## 2018-11-11 ENCOUNTER — Ambulatory Visit (INDEPENDENT_AMBULATORY_CARE_PROVIDER_SITE_OTHER): Payer: Medicare Other | Admitting: Physical Medicine and Rehabilitation

## 2018-11-11 ENCOUNTER — Encounter: Payer: Self-pay | Admitting: Physical Medicine and Rehabilitation

## 2018-11-11 ENCOUNTER — Other Ambulatory Visit: Payer: Self-pay

## 2018-11-11 DIAGNOSIS — R202 Paresthesia of skin: Secondary | ICD-10-CM | POA: Diagnosis not present

## 2018-11-11 NOTE — Progress Notes (Signed)
  Numeric Pain Rating Scale and Functional Assessment Average Pain 8   In the last MONTH (on 0-10 scale) has pain interfered with the following?  1. General activity like being  able to carry out your everyday physical activities such as walking, climbing stairs, carrying groceries, or moving a chair?  Rating(10)    

## 2018-11-14 NOTE — Procedures (Signed)
EMG & NCV Findings: Evaluation of the left median (across palm) sensory nerve showed prolonged distal peak latency (Wrist, 3.8 ms) and prolonged distal peak latency (Palm, 2.1 ms).  The left ulnar sensory nerve showed decreased conduction velocity (Wrist-5th Digit, 35 m/s).  All remaining nerves (as indicated in the following tables) were within normal limits.    All examined muscles (as indicated in the following table) showed no evidence of electrical instability.    Impression: The above electrodiagnostic study is ABNORMAL and reveals evidence of a VERY mild left median nerve entrapment at the wrist affecting sensory components.   There is no significant electrodiagnostic evidence of any other focal nerve entrapment, brachial plexopathy or cervical radiculopathy. **[name].  There is no signs of return of cubital tunnel entrapment.  Recommendations: 1.  Follow-up with referring physician. 2.  Continue current management of symptoms.  ___________________________ Naaman PlummerFred Lilu Mcglown FAAPMR Board Certified, American Board of Physical Medicine and Rehabilitation    Nerve Conduction Studies Anti Sensory Summary Table   Stim Site NR Peak (ms) Norm Peak (ms) P-T Amp (V) Norm P-T Amp Site1 Site2 Delta-P (ms) Dist (cm) Vel (m/s) Norm Vel (m/s)  Left Median Acr Palm Anti Sensory (2nd Digit)  31.2C  Wrist    *3.8 <3.6 24.2 >10 Wrist Palm 1.7 0.0    Palm    *2.1 <2.0 25.5         Left Radial Anti Sensory (Base 1st Digit)  32.9C  Wrist    2.6 <3.1 26.9  Wrist Base 1st Digit 2.6 0.0    Left Ulnar Anti Sensory (5th Digit)  31.8C  Wrist    4.0 <4.0 24.5 >15.0 Wrist 5th Digit 4.0 14.0 *35 >38   Motor Summary Table   Stim Site NR Onset (ms) Norm Onset (ms) O-P Amp (mV) Norm O-P Amp Site1 Site2 Delta-0 (ms) Dist (cm) Vel (m/s) Norm Vel (m/s)  Left Median Motor (Abd Poll Brev)  33.3C  Wrist    3.4 <4.2 9.5 >5 Elbow Wrist 4.5  22.5 50 >50  Elbow    7.9  6.8         Left Ulnar Motor (Abd Dig Min)  33.2C  Wrist    3.3 <4.2 13.2 >3 B Elbow Wrist 4.0 22.0 55 >53  B Elbow    7.3  11.6  A Elbow B Elbow 1.3 9.5 73 >53  A Elbow    8.6  11.1          EMG   Side Muscle Nerve Root Ins Act Fibs Psw Amp Dur Poly Recrt Int Dennie BiblePat Comment  Left Abd Poll Brev Median C8-T1 Nml Nml Nml Nml Nml 0 Nml Nml   Left 1stDorInt Ulnar C8-T1 Nml Nml Nml Nml Nml 0 Nml Nml   Left PronatorTeres Median C6-7 Nml Nml Nml Nml Nml 0 Nml Nml   Left Biceps Musculocut C5-6 Nml Nml Nml Nml Nml 0 Nml Nml   Left Deltoid Axillary C5-6 Nml Nml Nml Nml Nml 0 Nml Nml     Nerve Conduction Studies Anti Sensory Left/Right Comparison   Stim Site L Lat (ms) R Lat (ms) L-R Lat (ms) L Amp (V) R Amp (V) L-R Amp (%) Site1 Site2 L Vel (m/s) R Vel (m/s) L-R Vel (m/s)  Median Acr Palm Anti Sensory (2nd Digit)  31.2C  Wrist *3.8   24.2   Wrist Palm     Palm *2.1   25.5  Radial Anti Sensory (Base 1st Digit)  32.9C  Wrist 2.6   26.9   Wrist Base 1st Digit     Ulnar Anti Sensory (5th Digit)  31.8C  Wrist 4.0   24.5   Wrist 5th Digit *35     Motor Left/Right Comparison   Stim Site L Lat (ms) R Lat (ms) L-R Lat (ms) L Amp (mV) R Amp (mV) L-R Amp (%) Site1 Site2 L Vel (m/s) R Vel (m/s) L-R Vel (m/s)  Median Motor (Abd Poll Brev)  33.3C  Wrist 3.4   9.5   Elbow Wrist 50    Elbow 7.9   6.8         Ulnar Motor (Abd Dig Min)  33.2C  Wrist 3.3   13.2   B Elbow Wrist 55    B Elbow 7.3   11.6   A Elbow B Elbow 73    A Elbow 8.6   11.1            Waveforms:

## 2018-11-14 NOTE — Progress Notes (Signed)
Chad Frank - 58 y.o. male MRN [id number]  Date of birth: 1961/04/12  Office Visit Note: Visit Date: 11/11/2018 PCP: Chad Fire, MD Referred by: Chad Fire, MD  Subjective: Chief Complaint  Patient presents with  . Left Hand - Numbness   HPI: Chad Frank is a 58 y.o. male who comes in today At the request of Dr. Arther Frank for left upper extremity electrodiagnostic study.  Patient is left-hand dominant.  He was seen in the emergency department at the end of April with pain numbness and tingling in the left hand.  He reports worsening symptoms after doing yard work and activity.  He relates a lot of pain and throbbing into the thumb but also tingling numbness into the ulnar 3 digits somewhat nondermatomal he.  He has had a remote history several years ago of ulnar nerve either transposition or release.  On exam today maybe this was more of a release.  He saw Dr. Aline Frank as a telephone telemedicine visit.  He does not have any frank radicular pain or diabetes.  No other numbness or tingling or trauma.  He is tried steroid Dosepak and anti-inflammatories without relief.  ROS Otherwise per HPI.  Assessment & Plan: Visit Diagnoses:  1. Paresthesia of skin     Plan: Impression: The above electrodiagnostic study is ABNORMAL and reveals evidence of a VERY mild left median nerve entrapment at the wrist affecting sensory components.   There is no significant electrodiagnostic evidence of any other focal nerve entrapment, brachial plexopathy or cervical radiculopathy. **[name].  There is no signs of return of cubital tunnel entrapment.  Recommendations: 1.  Follow-up with referring physician. 2.  Continue current management of symptoms.  Meds & Orders: No orders of the defined types were placed in this encounter.   Orders Placed This Encounter  Procedures  . NCV with EMG (electromyography)     Follow-up: Return for Chad Abbott, MD.   Procedures: No procedures performed  EMG & NCV Findings: Evaluation of the left median (across palm) sensory nerve showed prolonged distal peak latency (Wrist, 3.8 ms) and prolonged distal peak latency (Palm, 2.1 ms).  The left ulnar sensory nerve showed decreased conduction velocity (Wrist-5th Digit, 35 m/s).  All remaining nerves (as indicated in the following tables) were within normal limits.    All examined muscles (as indicated in the following table) showed no evidence of electrical instability.    Impression: The above electrodiagnostic study is ABNORMAL and reveals evidence of a VERY mild left median nerve entrapment at the wrist affecting sensory components.   There is no significant electrodiagnostic evidence of any other focal nerve entrapment, brachial plexopathy or cervical radiculopathy. **[name].  There is no signs of return of cubital tunnel entrapment.  Recommendations: 1.  Follow-up with referring physician. 2.  Continue current management of symptoms.  ___________________________ Chad Frank Chad Frank Board Certified, American Board of Physical Medicine and Rehabilitation    Nerve Conduction Studies Anti Sensory Summary Table   Stim Site NR Peak (ms) Norm Peak (ms) P-T Amp (V) Norm P-T Amp Site1 Site2 Delta-P (ms) Dist (cm) Vel (m/s) Norm Vel (m/s)  Left Median Acr Palm Anti Sensory (2nd Digit)  31.2C  Wrist    *3.8 <3.6 24.2 >10 Wrist Palm 1.7 0.0    Palm    *2.1 <2.0 25.5  Left Radial Anti Sensory (Base 1st Digit)  32.9C  Wrist    2.6 <3.1 26.9  Wrist Base 1st Digit 2.6 0.0    Left Ulnar Anti Sensory (5th Digit)  31.8C  Wrist    4.0 <4.0 24.5 >15.0 Wrist 5th Digit 4.0 14.0 *35 >38   Motor Summary Table   Stim Site NR Onset (ms) Norm Onset (ms) O-P Amp (mV) Norm O-P Amp Site1 Site2 Delta-0 (ms) Dist (cm) Vel (m/s) Norm Vel (m/s)   Left Median Motor (Abd Poll Brev)  33.3C  Wrist    3.4 <4.2 9.5 >5 Elbow Wrist 4.5 22.5 50 >50  Elbow    7.9  6.8         Left Ulnar Motor (Abd Dig Min)  33.2C  Wrist    3.3 <4.2 13.2 >3 B Elbow Wrist 4.0 22.0 55 >53  B Elbow    7.3  11.6  A Elbow B Elbow 1.3 9.5 73 >53  A Elbow    8.6  11.1          EMG   Side Muscle Nerve Root Ins Act Fibs Psw Amp Dur Poly Recrt Int Dennie BiblePat Comment  Left Abd Poll Brev Median C8-T1 Nml Nml Nml Nml Nml 0 Nml Nml   Left 1stDorInt Ulnar C8-T1 Nml Nml Nml Nml Nml 0 Nml Nml   Left PronatorTeres Median C6-7 Nml Nml Nml Nml Nml 0 Nml Nml   Left Biceps Musculocut C5-6 Nml Nml Nml Nml Nml 0 Nml Nml   Left Deltoid Axillary C5-6 Nml Nml Nml Nml Nml 0 Nml Nml     Nerve Conduction Studies Anti Sensory Left/Right Comparison   Stim Site L Lat (ms) R Lat (ms) L-R Lat (ms) L Amp (V) R Amp (V) L-R Amp (%) Site1 Site2 L Vel (m/s) R Vel (m/s) L-R Vel (m/s)  Median Acr Palm Anti Sensory (2nd Digit)  31.2C  Wrist *3.8   24.2   Wrist Palm     Palm *2.1   25.5         Radial Anti Sensory (Base 1st Digit)  32.9C  Wrist 2.6   26.9   Wrist Base 1st Digit     Ulnar Anti Sensory (5th Digit)  31.8C  Wrist 4.0   24.5   Wrist 5th Digit *35     Motor Left/Right Comparison   Stim Site L Lat (ms) R Lat (ms) L-R Lat (ms) L Amp (mV) R Amp (mV) L-R Amp (%) Site1 Site2 L Vel (m/s) R Vel (m/s) L-R Vel (m/s)  Median Motor (Abd Poll Brev)  33.3C  Wrist 3.4   9.5   Elbow Wrist 50    Elbow 7.9   6.8         Ulnar Motor (Abd Dig Min)  33.2C  Wrist 3.3   13.2   B Elbow Wrist 55    B Elbow 7.3   11.6   A Elbow B Elbow 73    A Elbow 8.6   11.1            Waveforms:            Clinical History: No specialty comments available.   He reports that he has been smoking cigarettes. He has been smoking about 0.50 packs per day. He has never used smokeless tobacco. No results for input(s): HGBA1C, LABURIC in the last 8760 hours.  Objective:  VS:  HT:    WT:   BMI:     BP:  HR: bpm  TEMP: ( )  RESP:  Physical Exam Musculoskeletal:        General: No tenderness.     Comments: Inspection reveals no atrophy of the bilateral APB or FDI or hand intrinsics. There is no swelling, color changes, allodynia or dystrophic changes. There is 5 out of 5 strength in the bilateral wrist extension, finger abduction and long finger flexion. There is intact sensation to light touch in all dermatomal and peripheral nerve distributions. There is a negative Froment's test bilaterally.  There is an equivocally positive Tinel's test over the left wrist but a negative Phalen's test.  There is a negative Hoffmann's test bilaterally.  Skin:    General: Skin is warm and dry.     Findings: No erythema or rash.  Neurological:     Mental Status: He is alert and oriented to person, place, and time.     Motor: No abnormal muscle tone.     Coordination: Coordination normal.     Ortho Exam Imaging: No results found.  Past Medical/Family/Surgical/Social History: Medications & Allergies reviewed per EMR, new medications updated. Patient Active Problem List   Diagnosis Date Noted  . Abdominal pain 11/04/2017  . Constipation 11/04/2017  . Chest pain 10/13/2016  . Abnormal EKG 10/13/2016  . HTN (hypertension) 10/13/2016  . Hyperlipidemia 10/13/2016   Past Medical History:  Diagnosis Date  . Hypertension   . Irregular heart rhythm   . Substance abuse (HCC)    Family History  Problem Relation Age of Onset  . Colon cancer Neg Hx    Past Surgical History:  Procedure Laterality Date  . MANDIBLE FRACTURE SURGERY    . NERVE SURGERY Left    Social History   Occupational History  . Not on file  Tobacco Use  . Smoking status: Current Every Day Smoker    Packs/day: 0.50    Types: Cigarettes  . Smokeless tobacco: Never Used  Substance and Sexual Activity  . Alcohol use: Not Currently    Comment: 1 case every 2-3 days  . Drug use: Not Currently    Types: Cocaine, "Crack" cocaine     Comment: "crack" used about 09/11/17-Denies any use on 05/07/18  . Sexual activity: Not on file

## 2018-11-23 ENCOUNTER — Other Ambulatory Visit: Payer: Self-pay

## 2018-11-23 ENCOUNTER — Ambulatory Visit (INDEPENDENT_AMBULATORY_CARE_PROVIDER_SITE_OTHER): Payer: Medicare Other

## 2018-11-23 ENCOUNTER — Encounter: Payer: Self-pay | Admitting: Orthopedic Surgery

## 2018-11-23 ENCOUNTER — Ambulatory Visit (INDEPENDENT_AMBULATORY_CARE_PROVIDER_SITE_OTHER): Payer: Medicare Other | Admitting: Orthopedic Surgery

## 2018-11-23 VITALS — HR 78 | Temp 97.2°F | Ht 66.0 in | Wt 163.0 lb

## 2018-11-23 DIAGNOSIS — M67432 Ganglion, left wrist: Secondary | ICD-10-CM

## 2018-11-23 DIAGNOSIS — M25532 Pain in left wrist: Secondary | ICD-10-CM | POA: Diagnosis not present

## 2018-11-23 DIAGNOSIS — G5622 Lesion of ulnar nerve, left upper limb: Secondary | ICD-10-CM

## 2018-11-23 NOTE — Progress Notes (Signed)
Chief Complaint  Patient presents with  . Hand Pain    left/ review nerve study     58 year old male status post cubital tunnel ulnar nerve transposition presents with nerve conduction study after work-up for stroke was negative he complains of decreased sensation in the small and ring finger and pain on the volar aspect of his distal forearm with pain there which is mild nonradiating dull aching appears to be located with changing size of a lesion    Review of Systems  Constitutional: Negative for chills and fever.  Musculoskeletal: Positive for joint pain.  Skin: Negative.   Neurological: Positive for tingling and sensory change.    Past Medical History:  Diagnosis Date  . Hypertension   . Irregular heart rhythm   . Substance abuse (HCC)      Current Outpatient Medications:  .  acetaminophen (TYLENOL) 325 MG tablet, Take 325 mg by mouth every 6 (six) hours as needed for mild pain or headache., Disp: , Rfl:  .  albuterol (VENTOLIN HFA) 108 (90 Base) MCG/ACT inhaler, Inhale into the lungs every 6 (six) hours as needed for wheezing or shortness of breath., Disp: , Rfl:  .  amLODipine (NORVASC) 5 MG tablet, Take 5 mg by mouth daily., Disp: , Rfl:  .  dexamethasone (DECADRON) 4 MG tablet, Take 1 tablet (4 mg total) by mouth 2 (two) times daily with a meal., Disp: 10 tablet, Rfl: 0 .  gabapentin (NEURONTIN) 300 MG capsule, Take 1 capsule (300 mg total) by mouth 3 (three) times daily., Disp: 30 capsule, Rfl: 0 .  ibuprofen (ADVIL,MOTRIN) 600 MG tablet, Take 1 tablet (600 mg total) by mouth every 6 (six) hours as needed., Disp: 30 tablet, Rfl: 0 .  linaclotide (LINZESS) 72 MCG capsule, Take 1 capsule (72 mcg total) by mouth daily before breakfast., Disp: 30 capsule, Rfl: 5 .  albuterol (PROVENTIL HFA;VENTOLIN HFA) 108 (90 Base) MCG/ACT inhaler, Inhale 2 puffs into the lungs 2 (two) times daily., Disp: , Rfl:   Pulse 78   Temp (!) 97.2 F (36.2 C)   Ht 5\' 6"  (1.676 m)   Wt 163 lb  (73.9 kg)   BMI 26.31 kg/m   Physical Exam Vitals signs and nursing note reviewed.  Constitutional:      Appearance: Normal appearance.  Neurological:     Mental Status: He is alert and oriented to person, place, and time.  Psychiatric:        Mood and Affect: Mood normal.    Left upper extremity TENDERNESS and small mass over the radial artery consistent with a ganglion cyst measures 2 x 3 mm ROM normal INSTABILITY none MOTOR excellent SKIN clean no lesions   CDV color capillary refill radial and ulnar pulse normal Allen test normal SENSATION normal median nerve decreased sensation small finger LYMPH epitrochlear lymph nodes negative   MEDICAL DECISION New problem wrist ganglion Order x-ray Summary of Dr. Mineralwells BlasNewton's report: His report indicates that the patient had a left ulnar nerve transposition in the past he has some slowing across the hand at the fifth digit which accounts for his small finger numbness he also has mild carpal tunnel nerve compression without carpal tunnel symptoms  Nerve study Impression: The above electrodiagnostic study is ABNORMAL and reveals evidence of a VERY mild left median nerve entrapment at the wrist affecting sensory components.   EMG & NCV Findings: Evaluation of the left median (across palm) sensory nerve showed prolonged distal peak latency (Wrist, 3.8 ms) and prolonged  distal peak latency (Palm, 2.1 ms).  The left ulnar sensory nerve showed decreased conduction velocity (Wrist-5th Digit, 35 m/s).  All remaining nerves (as indicated in the following tables) were within normal limits.    Data:    X-rays-left wrist x-ray  X-ray was ordered my report is in the chart wrist was normal  Encounter Diagnoses  Name Primary?  . Left wrist pain   . Entrapment of left ulnar nerve, status post ulnar nerve transposition in [place]   . Ganglion, left wrist Yes    Plan:   Recommend non-surgical treatment patient does not want to have  surgery he says is not bothering him enough

## 2018-11-23 NOTE — Progress Notes (Signed)
107/70 

## 2018-12-08 DIAGNOSIS — F319 Bipolar disorder, unspecified: Secondary | ICD-10-CM | POA: Diagnosis not present

## 2018-12-08 DIAGNOSIS — F17210 Nicotine dependence, cigarettes, uncomplicated: Secondary | ICD-10-CM | POA: Diagnosis not present

## 2018-12-08 DIAGNOSIS — F17200 Nicotine dependence, unspecified, uncomplicated: Secondary | ICD-10-CM | POA: Diagnosis not present

## 2018-12-08 DIAGNOSIS — J45909 Unspecified asthma, uncomplicated: Secondary | ICD-10-CM | POA: Diagnosis not present

## 2018-12-08 DIAGNOSIS — I10 Essential (primary) hypertension: Secondary | ICD-10-CM | POA: Diagnosis not present

## 2018-12-15 ENCOUNTER — Other Ambulatory Visit: Payer: Medicare Other

## 2018-12-15 ENCOUNTER — Other Ambulatory Visit: Payer: Self-pay

## 2018-12-15 DIAGNOSIS — R6889 Other general symptoms and signs: Secondary | ICD-10-CM | POA: Diagnosis not present

## 2018-12-15 DIAGNOSIS — Z20822 Contact with and (suspected) exposure to covid-19: Secondary | ICD-10-CM

## 2018-12-17 IMAGING — DX DG CHEST 2V
2 series · 2 of 2 positions shown · non-contrast
Comparison: October 13, 2016

CLINICAL DATA: Abdominal pain.  Tobacco use.

EXAM:
CHEST - 2 VIEW

[chest pa]
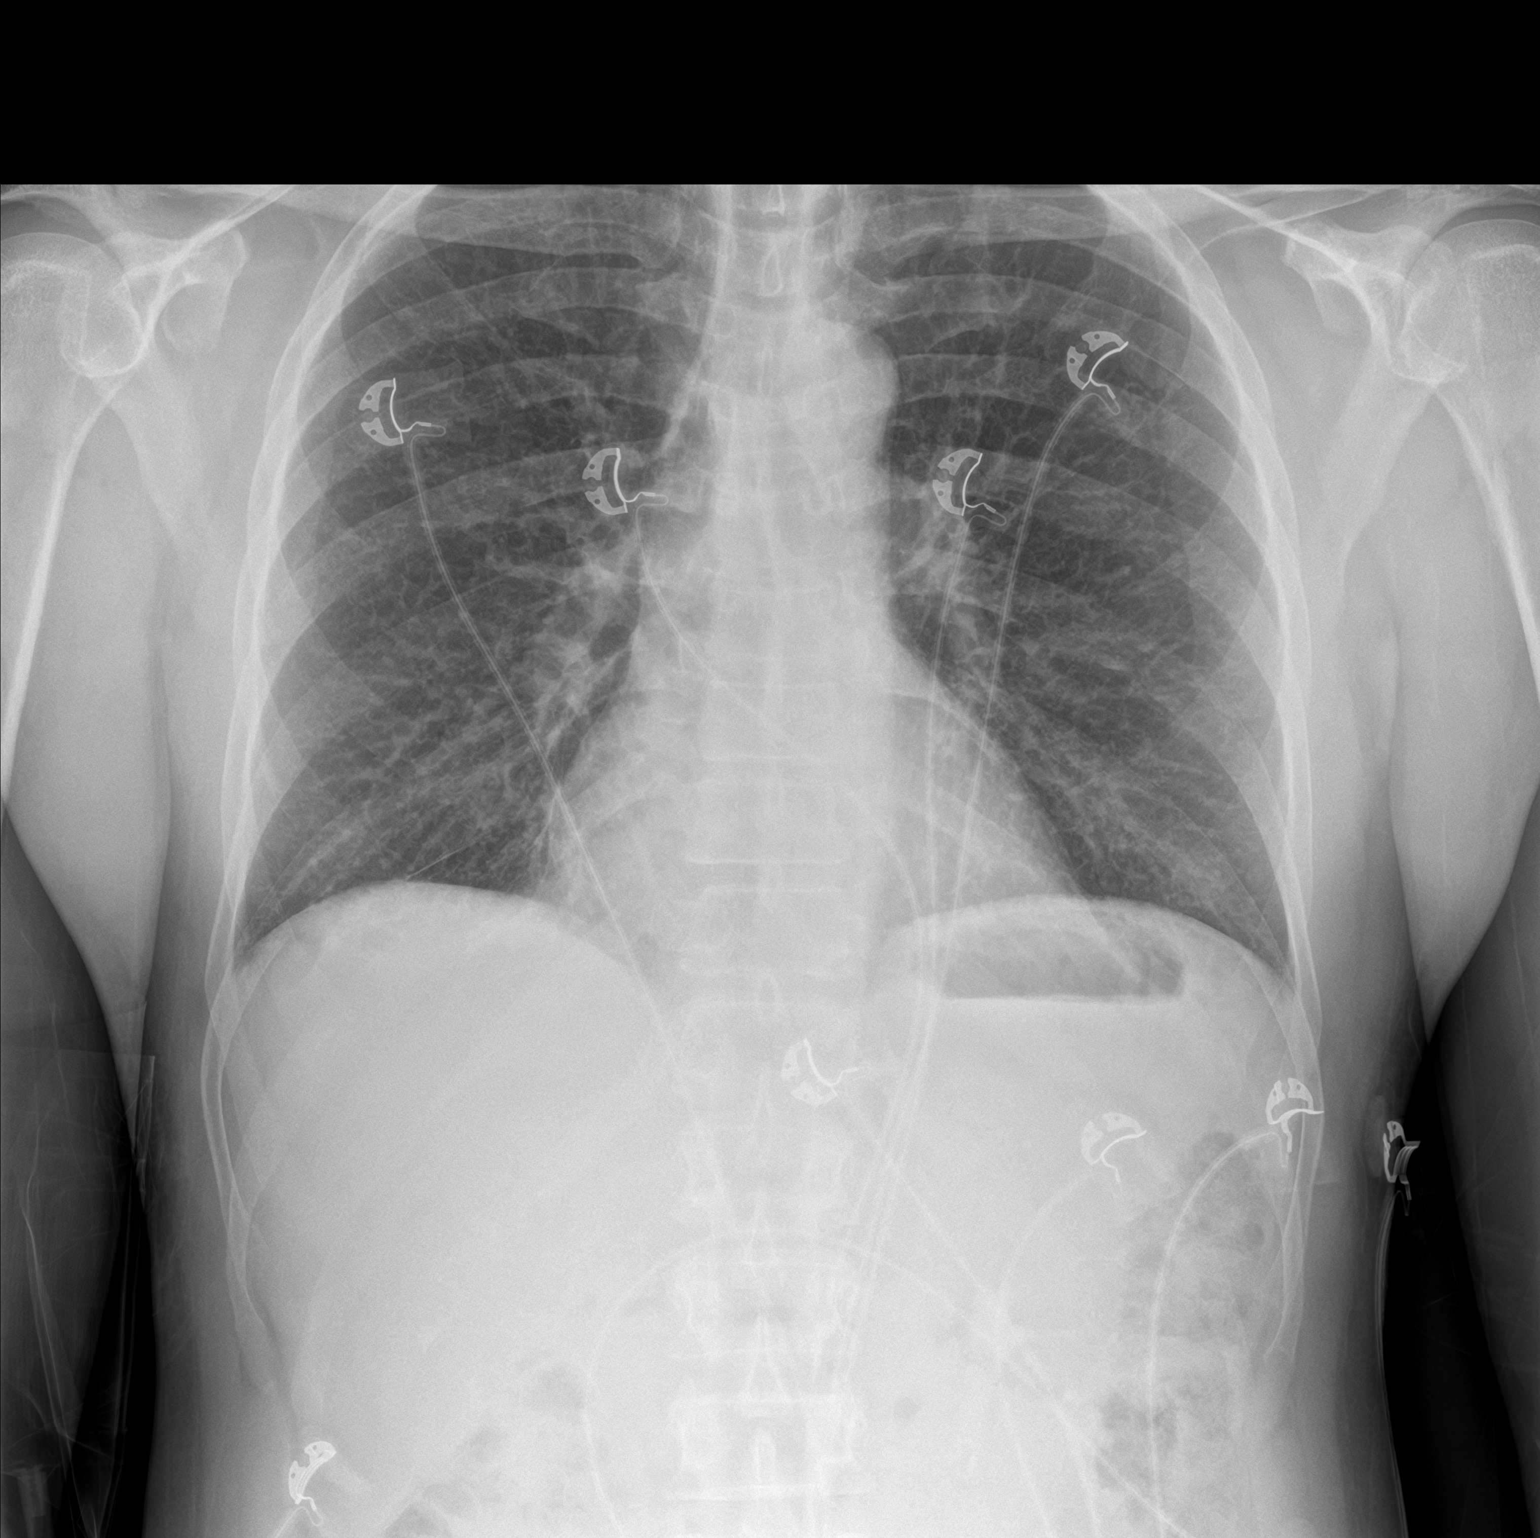

[chest lat]
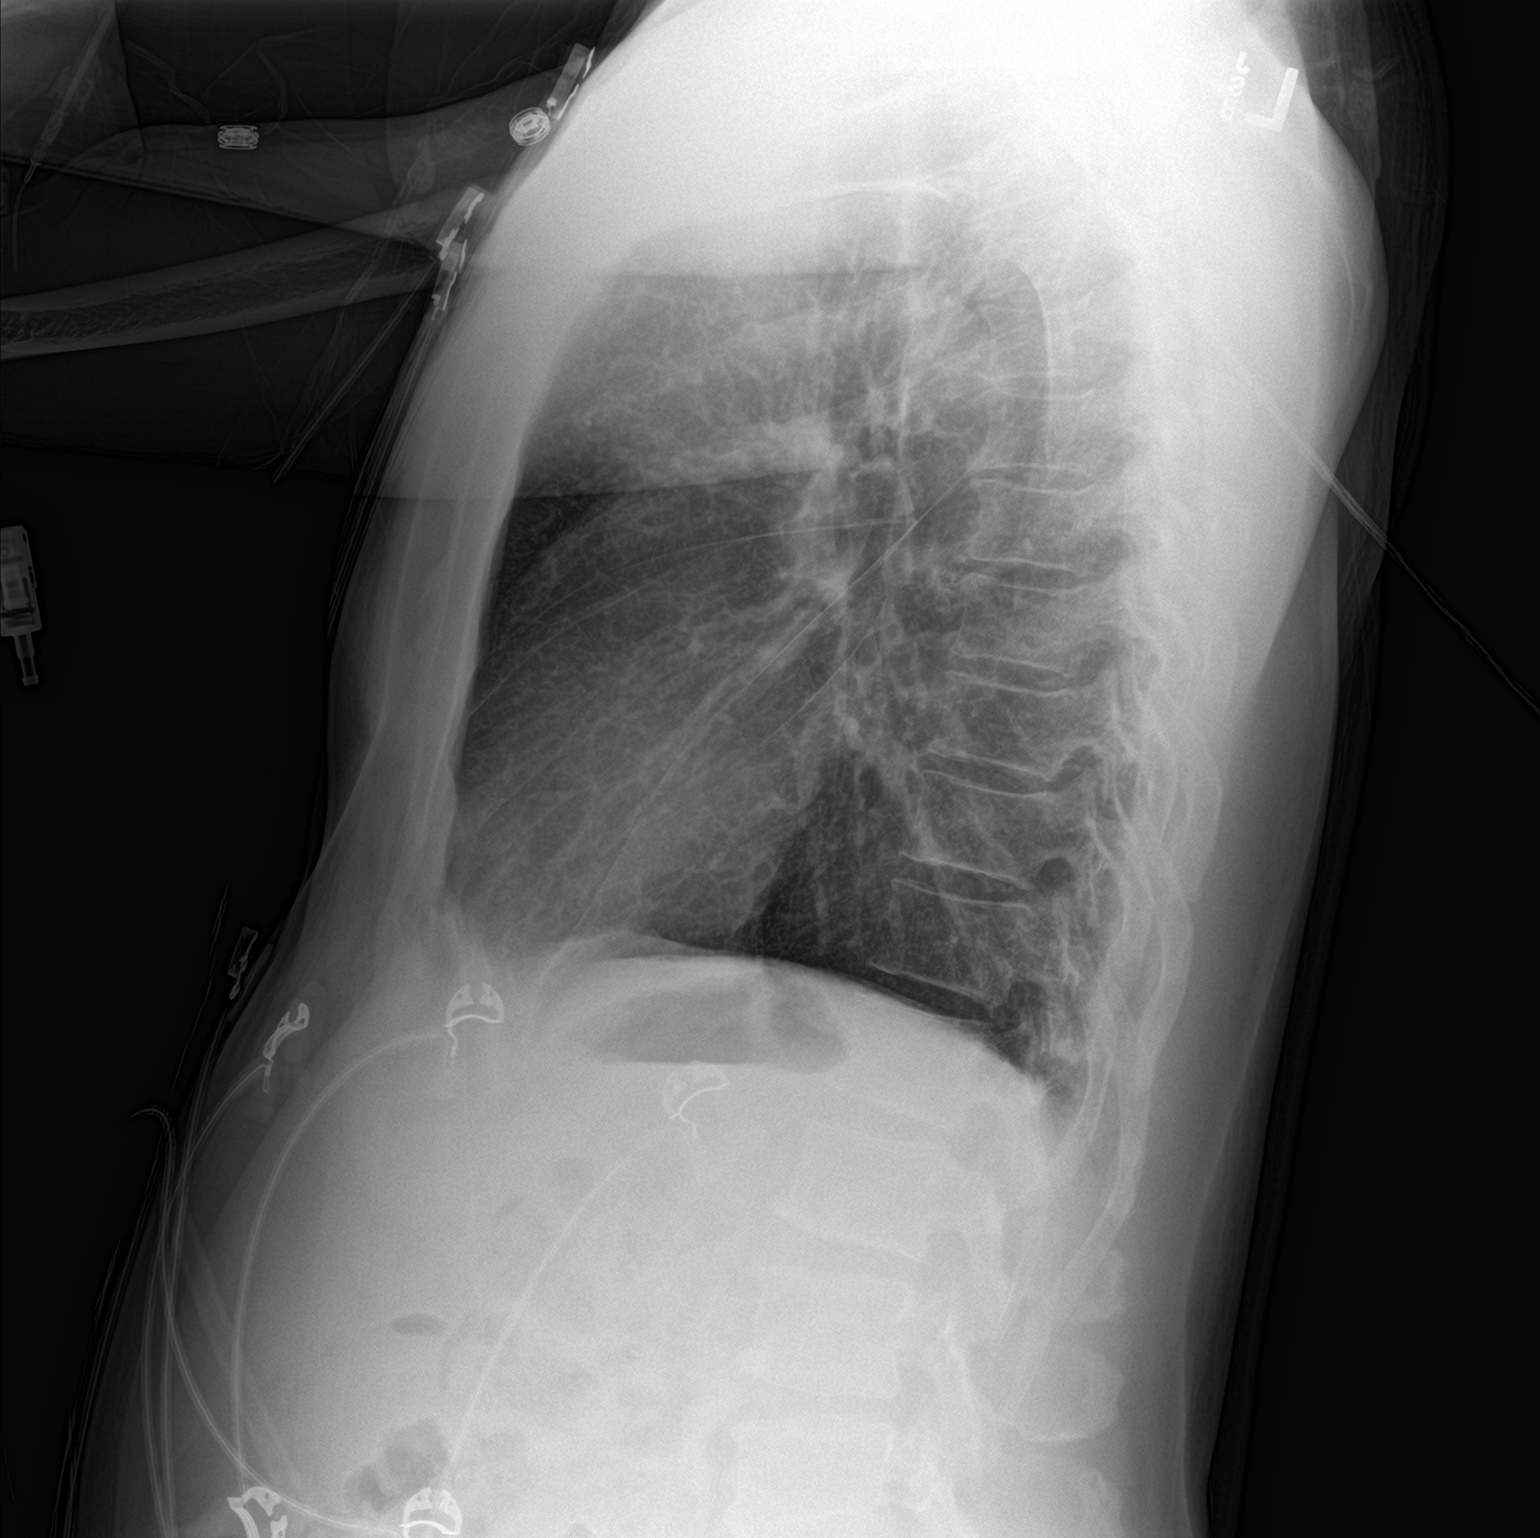

[2 of 2 positions shown; findings below may reference images not displayed]

FINDINGS: No edema or consolidation. Heart size and pulmonary vascularity are
normal. No adenopathy. No pneumothorax. No bone lesions.
IMPRESSION: No edema or consolidation.

## 2018-12-21 LAB — NOVEL CORONAVIRUS, NAA: SARS-CoV-2, NAA: NOT DETECTED

## 2019-01-08 DIAGNOSIS — J45909 Unspecified asthma, uncomplicated: Secondary | ICD-10-CM | POA: Diagnosis not present

## 2019-01-08 DIAGNOSIS — I10 Essential (primary) hypertension: Secondary | ICD-10-CM | POA: Diagnosis not present

## 2019-01-27 DIAGNOSIS — I10 Essential (primary) hypertension: Secondary | ICD-10-CM | POA: Diagnosis not present

## 2019-01-27 DIAGNOSIS — F17200 Nicotine dependence, unspecified, uncomplicated: Secondary | ICD-10-CM | POA: Diagnosis not present

## 2019-01-27 DIAGNOSIS — F319 Bipolar disorder, unspecified: Secondary | ICD-10-CM | POA: Diagnosis not present

## 2019-02-02 ENCOUNTER — Other Ambulatory Visit: Payer: Self-pay

## 2019-02-02 ENCOUNTER — Emergency Department (HOSPITAL_COMMUNITY)
Admission: EM | Admit: 2019-02-02 | Discharge: 2019-02-03 | Disposition: A | Discharge status: Home / Self Care | Payer: Medicare Other | Attending: Emergency Medicine | Admitting: Emergency Medicine

## 2019-02-02 ENCOUNTER — Encounter (HOSPITAL_COMMUNITY): Payer: Self-pay | Admitting: Emergency Medicine

## 2019-02-02 DIAGNOSIS — J45909 Unspecified asthma, uncomplicated: Secondary | ICD-10-CM | POA: Diagnosis not present

## 2019-02-02 DIAGNOSIS — Z79899 Other long term (current) drug therapy: Secondary | ICD-10-CM | POA: Insufficient documentation

## 2019-02-02 DIAGNOSIS — F17210 Nicotine dependence, cigarettes, uncomplicated: Secondary | ICD-10-CM | POA: Diagnosis not present

## 2019-02-02 DIAGNOSIS — R918 Other nonspecific abnormal finding of lung field: Secondary | ICD-10-CM | POA: Diagnosis not present

## 2019-02-02 DIAGNOSIS — J181 Lobar pneumonia, unspecified organism: Secondary | ICD-10-CM | POA: Insufficient documentation

## 2019-02-02 DIAGNOSIS — J189 Pneumonia, unspecified organism: Secondary | ICD-10-CM

## 2019-02-02 DIAGNOSIS — R109 Unspecified abdominal pain: Secondary | ICD-10-CM | POA: Diagnosis not present

## 2019-02-02 DIAGNOSIS — R1030 Lower abdominal pain, unspecified: Secondary | ICD-10-CM

## 2019-02-02 DIAGNOSIS — I10 Essential (primary) hypertension: Secondary | ICD-10-CM | POA: Insufficient documentation

## 2019-02-02 LAB — CBC
HCT: 43.3 % (ref 39.0–52.0)
Hemoglobin: 13.5 g/dL (ref 13.0–17.0)
MCH: 31.9 pg (ref 26.0–34.0)
MCHC: 31.2 g/dL (ref 30.0–36.0)
MCV: 102.4 fL — ABNORMAL HIGH (ref 80.0–100.0)
Platelets: 218 10*3/uL (ref 150–400)
RBC: 4.23 MIL/uL (ref 4.22–5.81)
RDW: 12.3 % (ref 11.5–15.5)
WBC: 5.2 10*3/uL (ref 4.0–10.5)
nRBC: 0 % (ref 0.0–0.2)

## 2019-02-02 LAB — COMPREHENSIVE METABOLIC PANEL
ALT: 13 U/L (ref 0–44)
AST: 19 U/L (ref 15–41)
Albumin: 4.5 g/dL (ref 3.5–5.0)
Alkaline Phosphatase: 63 U/L (ref 38–126)
Anion gap: 6 (ref 5–15)
BUN: 16 mg/dL (ref 6–20)
CO2: 26 mmol/L (ref 22–32)
Calcium: 9.4 mg/dL (ref 8.9–10.3)
Chloride: 109 mmol/L (ref 98–111)
Creatinine, Ser: 1.15 mg/dL (ref 0.61–1.24)
GFR calc Af Amer: >60 mL/min (ref >60)
GFR calc non Af Amer: >60 mL/min (ref >60)
Glucose, Bld: 123 mg/dL — ABNORMAL HIGH (ref 70–99)
Potassium: 3.5 mmol/L (ref 3.5–5.1)
Sodium: 141 mmol/L (ref 135–145)
Total Bilirubin: 0.5 mg/dL (ref 0.3–1.2)
Total Protein: 7.7 g/dL (ref 6.5–8.1)

## 2019-02-02 LAB — LIPASE, BLOOD: Lipase: 71 U/L — ABNORMAL HIGH (ref 11–51)

## 2019-02-02 NOTE — ED Provider Notes (Signed)
MSE was initiated and I personally evaluated the patient and placed orders (if any) at  11:00 PM on February 02, 2019.  The patient appears stable so that the remainder of the MSE may be completed by another provider.  Patient is a 58 year old male with a history of hypertension presenting for acute abdominal pain that began at 4:30 PM that a duration of 30 minutes.  Patient states the pain was located in the lower abdomen.  Patient states after the pain subsided a dull ache persisted with 1 out of 10 pain.  Patient has not eaten much since pain subsided but has passed gas with no relief.  Last bowel movement was this morning.  No blood or pain with defecation.  Patient states he was reclined on couch when episode of pain occurred.  Patient denies any history of liver or gallbladder disease.      Pati Gallo [place], [place] 02/03/19 [place]    Noemi Chapel, MD 02/03/19 [phone]

## 2019-02-02 NOTE — ED Triage Notes (Signed)
Pt reports lower mid-abdominal pain that started suddenly an hour ago. The pain doesn't radiate anywhere. Pt denies urinary symptoms. Pt reports BM that was normal this morning. Pt denies any other symptoms.

## 2019-02-02 NOTE — ED Notes (Signed)
Patient came to nursing station to state that he needed water. I told pt that he was NPO until being seen by the provider.

## 2019-02-02 NOTE — ED Notes (Signed)
Patient states his lower abdomen was cramping and stated "It felt like I was abou tto have a baby".

## 2019-02-03 ENCOUNTER — Emergency Department (HOSPITAL_COMMUNITY): Payer: Medicare Other

## 2019-02-03 DIAGNOSIS — R918 Other nonspecific abnormal finding of lung field: Secondary | ICD-10-CM | POA: Diagnosis not present

## 2019-02-03 DIAGNOSIS — R109 Unspecified abdominal pain: Secondary | ICD-10-CM | POA: Diagnosis not present

## 2019-02-03 DIAGNOSIS — R1030 Lower abdominal pain, unspecified: Secondary | ICD-10-CM | POA: Diagnosis not present

## 2019-02-03 LAB — URINALYSIS, ROUTINE W REFLEX MICROSCOPIC
Bilirubin Urine: NEGATIVE
Glucose, UA: NEGATIVE mg/dL
Hgb urine dipstick: NEGATIVE
Ketones, ur: NEGATIVE mg/dL
Leukocytes,Ua: NEGATIVE
Nitrite: NEGATIVE
Protein, ur: NEGATIVE mg/dL
Specific Gravity, Urine: 1.02 (ref 1.005–1.030)
pH: 6 (ref 5.0–8.0)

## 2019-02-03 MED ORDER — HYOSCYAMINE SULFATE 0.125 MG PO TABS
0.2500 mg | ORAL_TABLET | Freq: Once | ORAL | Status: DC
  Filled 2019-02-03: qty 2

## 2019-02-03 MED ORDER — AMOXICILLIN 250 MG PO CAPS: 1000.0000 mg | ORAL_CAPSULE | Freq: Once | ORAL | Status: DC

## 2019-02-03 MED ORDER — AMOXICILLIN 500 MG PO CAPS: 1000.0000 mg | ORAL_CAPSULE | Freq: Two times a day (BID) | ORAL | 0 refills | Status: DC

## 2019-02-03 NOTE — ED Provider Notes (Signed)
[facility] EMERGENCY DEPARTMENT Provider Note   CSN: [id number] Arrival date & time: 02/02/19  1955     History   Chief Complaint Chief Complaint  Patient presents with  . Abdominal Pain    lower    HPI Chad Frank is a 58 y.o. male.     Patient presents to the emergency department for evaluation of abdominal pain.  Patient reports that he had been fine for most of the day, then this evening had sudden onset of sharp stabbing pain below his umbilicus.  Pain was constant after it began and lasted for about an hour and then resolved.  He has a slight dull pain still but the most severe pain has resolved.  No associated nausea, vomiting, diarrhea or constipation.  He has had normal bowel movements today.  No associated urinary symptoms.  No fever.     Past Medical History:  Diagnosis Date  . Hypertension   . Irregular heart rhythm   . Substance abuse [facility])     Patient Active Problem List   Diagnosis Date Noted  . Abdominal pain 11/04/2017  . Constipation 11/04/2017  . Chest pain 10/13/2016  . Abnormal EKG 10/13/2016  . HTN (hypertension) 10/13/2016  . Hyperlipidemia 10/13/2016  . Anemia 09/25/2016  . Anxiety 09/25/2016  . Moderate episode of recurrent major depressive disorder (HCC) 05/29/2016  . Laceration of finger with infection 08/14/2015  . Lung nodule 12/18/2014  . Tension type headache 11/15/2014  . Asthma 02/10/2013  . Bronchitis 02/10/2013  . Cocaine-related disorder (HCC) 02/10/2013  . Tobacco abuse 02/10/2013    Past Surgical History:  Procedure Laterality Date  . MANDIBLE FRACTURE SURGERY    . NERVE SURGERY Left         Home Medications    Prior to Admission medications   Medication Sig Start Date End Date Taking? Authorizing Provider  acetaminophen (TYLENOL) 325 MG tablet Take 325 mg by mouth every 6 (six) hours as needed for mild pain or headache.    [provider]  albuterol (PROVENTIL HFA;VENTOLIN HFA) 108 (90 Base) MCG/ACT  inhaler Inhale 2 puffs into the lungs 2 (two) times daily. 09/25/16 09/25/17  [provider]  albuterol (VENTOLIN HFA) 108 (90 Base) MCG/ACT inhaler Inhale into the lungs every 6 (six) hours as needed for wheezing or shortness of breath.    [provider]  amLODipine (NORVASC) 5 MG tablet Take 5 mg by mouth daily. 09/25/16   [provider]  amoxicillin (AMOXIL) 500 MG capsule Take 2 capsules (1,000 mg total) by mouth 2 (two) times daily. 02/03/19   Gilda Crease, MD  dexamethasone (DECADRON) 4 MG tablet Take 1 tablet (4 mg total) by mouth 2 (two) times daily with a meal. 10/05/18   Raeford Razor, MD  gabapentin (NEURONTIN) 300 MG capsule Take 1 capsule (300 mg total) by mouth 3 (three) times daily. 10/05/18   Raeford Razor, MD  ibuprofen (ADVIL,MOTRIN) 600 MG tablet Take 1 tablet (600 mg total) by mouth every 6 (six) hours as needed. 05/07/18   Jacalyn Lefevre, MD  linaclotide Karlene Einstein) 72 MCG capsule Take 1 capsule (72 mcg total) by mouth daily before breakfast. 11/12/17   Anice Paganini, NP    Family History Family History  Problem Relation Age of Onset  . Colon cancer Neg Hx     Social History Social History   Tobacco Use  . Smoking status: Current Every Day Smoker    Packs/day: 0.50    Types: Cigarettes  .  Smokeless tobacco: Never Used  Substance Use Topics  . Alcohol use: Not Currently    Comment: 1 case every 2-3 days  . Drug use: Not Currently    Types: Cocaine, "Crack" cocaine    Comment: "crack" used about 09/11/17-Denies any use on 05/07/18, 02/02/19     Allergies   Bee venom, Other, and Shellfish allergy   Review of Systems Review of Systems  Gastrointestinal: Positive for abdominal pain.  All other systems reviewed and are negative.    Physical Exam Updated Vital Signs BP 124/70   Pulse (!) 54   Temp 98.1 F (36.7 C) (Oral)   Resp 12   Ht 5\' 6"  (1.676 m)   Wt 72.6 kg   SpO2 98%   BMI 25.82 kg/m   Physical Exam Vitals  signs and nursing note reviewed.  Constitutional:      General: He is not in acute distress.    Appearance: Normal appearance. He is well-developed.  HENT:     Head: Normocephalic and atraumatic.     Right Ear: Hearing normal.     Left Ear: Hearing normal.     Nose: Nose normal.  Eyes:     Conjunctiva/sclera: Conjunctivae normal.     Pupils: Pupils are equal, round, and reactive to light.  Neck:     Musculoskeletal: Normal range of motion and neck supple.  Cardiovascular:     Rate and Rhythm: Regular rhythm.     Heart sounds: S1 normal and S2 normal. No murmur. No friction rub. No gallop.   Pulmonary:     Effort: Pulmonary effort is normal. No respiratory distress.     Breath sounds: Normal breath sounds.  Chest:     Chest wall: No tenderness.  Abdominal:     General: Bowel sounds are normal.     Palpations: Abdomen is soft.     Tenderness: There is no abdominal tenderness. There is no guarding or rebound. Negative signs include Murphy's sign and McBurney's sign.     Hernia: No hernia is present.  Musculoskeletal: Normal range of motion.  Skin:    General: Skin is warm and dry.     Findings: No rash.  Neurological:     Mental Status: He is alert and oriented to person, place, and time.     GCS: GCS eye subscore is 4. GCS verbal subscore is 5. GCS motor subscore is 6.     Cranial Nerves: No cranial nerve deficit.     Sensory: No sensory deficit.     Coordination: Coordination normal.  Psychiatric:        Speech: Speech normal.        Behavior: Behavior normal.        Thought Content: Thought content normal.      ED Treatments / Results  Labs (all labs ordered are listed, but only abnormal results are displayed) Labs Reviewed  LIPASE, BLOOD - Abnormal; Notable for the following components:      Result Value   Lipase 71 (*)    All other components within normal limits  COMPREHENSIVE METABOLIC PANEL - Abnormal; Notable for the following components:   Glucose, Bld 123  (*)    All other components within normal limits  CBC - Abnormal; Notable for the following components:   MCV 102.4 (*)    All other components within normal limits  URINALYSIS, ROUTINE W REFLEX MICROSCOPIC    EKG None  Radiology Dg Abd Acute 2+v W 1v Chest  Result Date: 02/03/2019  CLINICAL DATA:  Abdominal pain EXAM: DG ABDOMEN ACUTE W/ 1V CHEST COMPARISON:  09/18/2017 FINDINGS: Single-view chest demonstrates vague foci of opacity in the left peripheral lower lung and lung base. No pleural effusion. Normal heart size. Supine and upright views of the abdomen demonstrate no free air beneath the diaphragm. Nonobstructed bowel-gas pattern with moderate stool. No radiopaque calculi. IMPRESSION: 1. Vague foci of airspace opacity at the left lung base, question pneumonia 2. Nonobstructed bowel-gas pattern Electronically Signed   By: Jasmine PangKim  Fujinaga M.D.   On: 02/03/2019 01:08    Procedures Procedures (including critical care time)  Medications Ordered in ED Medications  hyoscyamine (LEVSIN) tablet 0.25 mg (has no administration in time range)  amoxicillin (AMOXIL) capsule 1,000 mg (has no administration in time range)     Initial Impression / Assessment and Plan / ED Course  I have reviewed the triage vital signs and the nursing notes.  Pertinent labs & imaging results that were available during my care of the patient were reviewed by me and considered in my medical decision making (see chart for details).        Patient presents to the emergency department for evaluation of lower abdominal pain.  Patient reports that symptoms began earlier today.  He had approximately 1 hour of moderately severe lower abdominal pain that then spontaneously resolved, now has a dull aching pain.  Pain is mostly suprapubic region.  Abdominal exam, however is benign.  No significant tenderness, normal bowel sounds.  Blood work unremarkable.  No leukocytosis.  He does have a slightly elevated lipase, history  of alcoholism but has not drank in nearly a year.  Significance of this is unclear.  Symptoms do not support acute pancreatitis.  Acute abdomen series does not show any evidence of bowel obstruction.  There is possible pneumonia in the left lung field.  Symptoms do not support pneumonia, does not have fever, cough, chest congestion and pain is not the upper abdominal fields to suggest pain from pneumonia.  Nonetheless we will cover with high-dose Amoxil.  Final Clinical Impressions(s) / ED Diagnoses   Final diagnoses:  Lower abdominal pain  Community acquired pneumonia of left lower lobe of lung [facility](HCC)    ED Discharge Orders         Ordered    amoxicillin (AMOXIL) 500 MG capsule  2 times daily     02/03/19 0138           Gilda CreasePollina, Terrence Wishon J, MD 02/03/19 [phone]

## 2019-02-10 ENCOUNTER — Other Ambulatory Visit: Payer: Self-pay

## 2019-02-10 DIAGNOSIS — R6889 Other general symptoms and signs: Secondary | ICD-10-CM | POA: Diagnosis not present

## 2019-02-10 DIAGNOSIS — Z20822 Contact with and (suspected) exposure to covid-19: Secondary | ICD-10-CM

## 2019-02-12 LAB — NOVEL CORONAVIRUS, NAA: SARS-CoV-2, NAA: NOT DETECTED

## 2019-02-26 DIAGNOSIS — J45909 Unspecified asthma, uncomplicated: Secondary | ICD-10-CM | POA: Diagnosis not present

## 2019-02-26 DIAGNOSIS — F319 Bipolar disorder, unspecified: Secondary | ICD-10-CM | POA: Diagnosis not present

## 2019-02-26 DIAGNOSIS — I10 Essential (primary) hypertension: Secondary | ICD-10-CM | POA: Diagnosis not present

## 2019-03-28 DIAGNOSIS — I10 Essential (primary) hypertension: Secondary | ICD-10-CM | POA: Diagnosis not present

## 2019-03-28 DIAGNOSIS — F319 Bipolar disorder, unspecified: Secondary | ICD-10-CM | POA: Diagnosis not present

## 2019-03-28 DIAGNOSIS — J45909 Unspecified asthma, uncomplicated: Secondary | ICD-10-CM | POA: Diagnosis not present

## 2019-04-07 ENCOUNTER — Other Ambulatory Visit: Payer: Self-pay

## 2019-04-07 DIAGNOSIS — Z20822 Contact with and (suspected) exposure to covid-19: Secondary | ICD-10-CM

## 2019-04-09 LAB — NOVEL CORONAVIRUS, NAA: SARS-CoV-2, NAA: NOT DETECTED

## 2019-04-10 ENCOUNTER — Telehealth: Payer: Self-pay | Admitting: Internal Medicine

## 2019-04-10 NOTE — Telephone Encounter (Signed)
Negative COVID results given. Patient results "NOT Detected." Caller expressed understanding. ° °

## 2019-06-05 ENCOUNTER — Other Ambulatory Visit (HOSPITAL_COMMUNITY)
Admission: RE | Admit: 2019-06-05 | Discharge: 2019-06-05 | Disposition: A | Discharge status: Home / Self Care | Payer: Medicare Other | Source: Ambulatory Visit | Attending: Internal Medicine | Admitting: Internal Medicine

## 2019-06-05 DIAGNOSIS — I10 Essential (primary) hypertension: Secondary | ICD-10-CM | POA: Diagnosis present

## 2019-06-05 DIAGNOSIS — Z0001 Encounter for general adult medical examination with abnormal findings: Secondary | ICD-10-CM | POA: Diagnosis present

## 2019-06-05 LAB — BASIC METABOLIC PANEL
Anion gap: 8 (ref 5–15)
BUN: 15 mg/dL (ref 6–20)
CO2: 25 mmol/L (ref 22–32)
Calcium: 9 mg/dL (ref 8.9–10.3)
Chloride: 105 mmol/L (ref 98–111)
Creatinine, Ser: 1.03 mg/dL (ref 0.61–1.24)
GFR calc Af Amer: >60 mL/min (ref >60)
GFR calc non Af Amer: >60 mL/min (ref >60)
Glucose, Bld: 77 mg/dL (ref 70–99)
Potassium: 3.9 mmol/L (ref 3.5–5.1)
Sodium: 138 mmol/L (ref 135–145)

## 2019-06-05 LAB — CBC WITH DIFFERENTIAL/PLATELET
Abs Immature Granulocytes: 0.01 10*3/uL (ref 0.00–0.07)
Basophils Absolute: 0 10*3/uL (ref 0.0–0.1)
Basophils Relative: 1 %
Eosinophils Absolute: 0.2 10*3/uL (ref 0.0–0.5)
Eosinophils Relative: 4 %
HCT: 44.4 % (ref 39.0–52.0)
Hemoglobin: 14.3 g/dL (ref 13.0–17.0)
Immature Granulocytes: 0 %
Lymphocytes Relative: 34 %
Lymphs Abs: 1.6 10*3/uL (ref 0.7–4.0)
MCH: 32.7 pg (ref 26.0–34.0)
MCHC: 32.2 g/dL (ref 30.0–36.0)
MCV: 101.6 fL — ABNORMAL HIGH (ref 80.0–100.0)
Monocytes Absolute: 0.5 10*3/uL (ref 0.1–1.0)
Monocytes Relative: 10 %
Neutro Abs: 2.5 10*3/uL (ref 1.7–7.7)
Neutrophils Relative %: 51 %
Platelets: 185 10*3/uL (ref 150–400)
RBC: 4.37 MIL/uL (ref 4.22–5.81)
RDW: 12.1 % (ref 11.5–15.5)
WBC: 4.9 10*3/uL (ref 4.0–10.5)
nRBC: 0 % (ref 0.0–0.2)

## 2019-06-05 LAB — LIPID PANEL
Cholesterol: 146 mg/dL (ref 0–200)
HDL: 41 mg/dL (ref >40)
LDL Cholesterol: 89 mg/dL (ref 0–99)
Total CHOL/HDL Ratio: 3.6 RATIO
Triglycerides: 79 mg/dL (ref <150)
VLDL: 16 mg/dL (ref 0–40)

## 2019-06-05 LAB — HEPATIC FUNCTION PANEL
ALT: 12 U/L (ref 0–44)
AST: 18 U/L (ref 15–41)
Albumin: 4.2 g/dL (ref 3.5–5.0)
Alkaline Phosphatase: 49 U/L (ref 38–126)
Bilirubin, Direct: 0.1 mg/dL (ref 0.0–0.2)
Indirect Bilirubin: 0.6 mg/dL (ref 0.3–0.9)
Total Bilirubin: 0.7 mg/dL (ref 0.3–1.2)
Total Protein: 7.2 g/dL (ref 6.5–8.1)

## 2019-07-30 DIAGNOSIS — J410 Simple chronic bronchitis: Secondary | ICD-10-CM | POA: Diagnosis not present

## 2019-08-27 DIAGNOSIS — J410 Simple chronic bronchitis: Secondary | ICD-10-CM | POA: Diagnosis not present

## 2019-09-12 DIAGNOSIS — H5213 Myopia, bilateral: Secondary | ICD-10-CM | POA: Diagnosis not present

## 2019-09-27 DIAGNOSIS — I10 Essential (primary) hypertension: Secondary | ICD-10-CM | POA: Diagnosis not present

## 2019-10-16 DIAGNOSIS — H524 Presbyopia: Secondary | ICD-10-CM | POA: Diagnosis not present

## 2019-10-16 DIAGNOSIS — H52223 Regular astigmatism, bilateral: Secondary | ICD-10-CM | POA: Diagnosis not present

## 2019-11-21 DIAGNOSIS — Z1389 Encounter for screening for other disorder: Secondary | ICD-10-CM | POA: Diagnosis not present

## 2019-11-21 DIAGNOSIS — Z0001 Encounter for general adult medical examination with abnormal findings: Secondary | ICD-10-CM | POA: Diagnosis not present

## 2019-11-21 DIAGNOSIS — I10 Essential (primary) hypertension: Secondary | ICD-10-CM | POA: Diagnosis not present

## 2019-11-21 DIAGNOSIS — F17210 Nicotine dependence, cigarettes, uncomplicated: Secondary | ICD-10-CM | POA: Diagnosis not present

## 2019-11-21 DIAGNOSIS — J410 Simple chronic bronchitis: Secondary | ICD-10-CM | POA: Diagnosis not present

## 2019-11-28 ENCOUNTER — Other Ambulatory Visit (HOSPITAL_COMMUNITY)
Admission: RE | Admit: 2019-11-28 | Discharge: 2019-11-28 | Disposition: A | Discharge status: Home / Self Care | Payer: Medicare Other | Source: Ambulatory Visit | Attending: Internal Medicine | Admitting: Internal Medicine

## 2019-11-28 ENCOUNTER — Other Ambulatory Visit: Payer: Self-pay

## 2019-11-28 DIAGNOSIS — Z0001 Encounter for general adult medical examination with abnormal findings: Secondary | ICD-10-CM | POA: Diagnosis not present

## 2019-11-28 DIAGNOSIS — I10 Essential (primary) hypertension: Secondary | ICD-10-CM | POA: Diagnosis not present

## 2019-11-28 DIAGNOSIS — Z79899 Other long term (current) drug therapy: Secondary | ICD-10-CM | POA: Insufficient documentation

## 2019-11-28 LAB — CBC WITH DIFFERENTIAL/PLATELET
Abs Immature Granulocytes: 0.01 10*3/uL (ref 0.00–0.07)
Basophils Absolute: 0 10*3/uL (ref 0.0–0.1)
Basophils Relative: 1 %
Eosinophils Absolute: 0.2 10*3/uL (ref 0.0–0.5)
Eosinophils Relative: 5 %
HCT: 38.9 % — ABNORMAL LOW (ref 39.0–52.0)
Hemoglobin: 12.2 g/dL — ABNORMAL LOW (ref 13.0–17.0)
Immature Granulocytes: 0 %
Lymphocytes Relative: 30 %
Lymphs Abs: 1.1 10*3/uL (ref 0.7–4.0)
MCH: 31.9 pg (ref 26.0–34.0)
MCHC: 31.4 g/dL (ref 30.0–36.0)
MCV: 101.8 fL — ABNORMAL HIGH (ref 80.0–100.0)
Monocytes Absolute: 0.4 10*3/uL (ref 0.1–1.0)
Monocytes Relative: 10 %
Neutro Abs: 1.9 10*3/uL (ref 1.7–7.7)
Neutrophils Relative %: 54 %
Platelets: 232 10*3/uL (ref 150–400)
RBC: 3.82 MIL/uL — ABNORMAL LOW (ref 4.22–5.81)
RDW: 12.9 % (ref 11.5–15.5)
WBC: 3.5 10*3/uL — ABNORMAL LOW (ref 4.0–10.5)
nRBC: 0 % (ref 0.0–0.2)

## 2019-11-28 LAB — LIPID PANEL
Cholesterol: 143 mg/dL (ref 0–200)
HDL: 49 mg/dL (ref >40)
LDL Cholesterol: 69 mg/dL (ref 0–99)
Total CHOL/HDL Ratio: 2.9 RATIO
Triglycerides: 125 mg/dL (ref <150)
VLDL: 25 mg/dL (ref 0–40)

## 2019-11-28 LAB — BASIC METABOLIC PANEL
Anion gap: 11 (ref 5–15)
BUN: 13 mg/dL (ref 8–23)
CO2: 24 mmol/L (ref 22–32)
Calcium: 9.5 mg/dL (ref 8.9–10.3)
Chloride: 106 mmol/L (ref 98–111)
Creatinine, Ser: 1.05 mg/dL (ref 0.61–1.24)
GFR calc Af Amer: >60 mL/min (ref >60)
GFR calc non Af Amer: >60 mL/min (ref >60)
Glucose, Bld: 102 mg/dL — ABNORMAL HIGH (ref 70–99)
Potassium: 3.7 mmol/L (ref 3.5–5.1)
Sodium: 141 mmol/L (ref 135–145)

## 2019-11-28 LAB — HEPATIC FUNCTION PANEL
ALT: 23 U/L (ref 0–44)
AST: 28 U/L (ref 15–41)
Albumin: 4 g/dL (ref 3.5–5.0)
Alkaline Phosphatase: 50 U/L (ref 38–126)
Bilirubin, Direct: <0.1 mg/dL (ref 0.0–0.2)
Total Bilirubin: 0.6 mg/dL (ref 0.3–1.2)
Total Protein: 7 g/dL (ref 6.5–8.1)

## 2019-11-29 ENCOUNTER — Encounter: Payer: Self-pay | Admitting: Internal Medicine

## 2019-12-10 ENCOUNTER — Other Ambulatory Visit: Payer: Self-pay

## 2019-12-10 ENCOUNTER — Encounter (HOSPITAL_COMMUNITY): Payer: Self-pay | Admitting: *Deleted

## 2019-12-10 ENCOUNTER — Emergency Department (HOSPITAL_COMMUNITY)
Admission: EM | Admit: 2019-12-10 | Discharge: 2019-12-13 | Disposition: A | Discharge status: Other Acute Inpatient Hospital | Payer: Medicare HMO | Attending: Emergency Medicine | Admitting: Emergency Medicine

## 2019-12-10 DIAGNOSIS — F329 Major depressive disorder, single episode, unspecified: Secondary | ICD-10-CM | POA: Diagnosis present

## 2019-12-10 DIAGNOSIS — I10 Essential (primary) hypertension: Secondary | ICD-10-CM | POA: Insufficient documentation

## 2019-12-10 DIAGNOSIS — Z20822 Contact with and (suspected) exposure to covid-19: Secondary | ICD-10-CM | POA: Diagnosis not present

## 2019-12-10 DIAGNOSIS — F1910 Other psychoactive substance abuse, uncomplicated: Secondary | ICD-10-CM | POA: Diagnosis not present

## 2019-12-10 DIAGNOSIS — R45851 Suicidal ideations: Secondary | ICD-10-CM | POA: Diagnosis not present

## 2019-12-10 DIAGNOSIS — F333 Major depressive disorder, recurrent, severe with psychotic symptoms: Secondary | ICD-10-CM | POA: Diagnosis not present

## 2019-12-10 DIAGNOSIS — J45909 Unspecified asthma, uncomplicated: Secondary | ICD-10-CM | POA: Diagnosis not present

## 2019-12-10 DIAGNOSIS — Z79899 Other long term (current) drug therapy: Secondary | ICD-10-CM | POA: Insufficient documentation

## 2019-12-10 DIAGNOSIS — F17210 Nicotine dependence, cigarettes, uncomplicated: Secondary | ICD-10-CM | POA: Insufficient documentation

## 2019-12-10 LAB — COMPREHENSIVE METABOLIC PANEL
ALT: 18 U/L (ref 0–44)
AST: 22 U/L (ref 15–41)
Albumin: 4.7 g/dL (ref 3.5–5.0)
Alkaline Phosphatase: 56 U/L (ref 38–126)
Anion gap: 8 (ref 5–15)
BUN: 15 mg/dL (ref 8–23)
CO2: 27 mmol/L (ref 22–32)
Calcium: 9.8 mg/dL (ref 8.9–10.3)
Chloride: 106 mmol/L (ref 98–111)
Creatinine, Ser: 0.96 mg/dL (ref 0.61–1.24)
GFR calc Af Amer: >60 mL/min (ref >60)
GFR calc non Af Amer: >60 mL/min (ref >60)
Glucose, Bld: 76 mg/dL (ref 70–99)
Potassium: 4.2 mmol/L (ref 3.5–5.1)
Sodium: 141 mmol/L (ref 135–145)
Total Bilirubin: 0.7 mg/dL (ref 0.3–1.2)
Total Protein: 8.2 g/dL — ABNORMAL HIGH (ref 6.5–8.1)

## 2019-12-10 LAB — RAPID URINE DRUG SCREEN, HOSP PERFORMED
Amphetamines: NOT DETECTED
Barbiturates: NOT DETECTED
Benzodiazepines: NOT DETECTED
Cocaine: POSITIVE — AB
Opiates: NOT DETECTED
Tetrahydrocannabinol: NOT DETECTED

## 2019-12-10 LAB — CBC
HCT: 43.5 % (ref 39.0–52.0)
Hemoglobin: 13.6 g/dL (ref 13.0–17.0)
MCH: 32.3 pg (ref 26.0–34.0)
MCHC: 31.3 g/dL (ref 30.0–36.0)
MCV: 103.3 fL — ABNORMAL HIGH (ref 80.0–100.0)
Platelets: 232 10*3/uL (ref 150–400)
RBC: 4.21 MIL/uL — ABNORMAL LOW (ref 4.22–5.81)
RDW: 12.6 % (ref 11.5–15.5)
WBC: 4.5 10*3/uL (ref 4.0–10.5)
nRBC: 0 % (ref 0.0–0.2)

## 2019-12-10 LAB — SALICYLATE LEVEL: Salicylate Lvl: <7 mg/dL — ABNORMAL LOW (ref 7.0–30.0)

## 2019-12-10 LAB — ACETAMINOPHEN LEVEL: Acetaminophen (Tylenol), Serum: <10 ug/mL — ABNORMAL LOW (ref 10–30)

## 2019-12-10 LAB — ETHANOL: Alcohol, Ethyl (B): <10 mg/dL (ref <10)

## 2019-12-10 LAB — TROPONIN I (HIGH SENSITIVITY): Troponin I (High Sensitivity): 3 ng/L (ref <18)

## 2019-12-10 MED ORDER — TRAZODONE HCL 50 MG PO TABS
100.0000 mg | ORAL_TABLET | Freq: Every day | ORAL | Status: DC
  Administered 2019-12-10 – 2019-12-12 (×3): 100 mg via ORAL
  Filled 2019-12-10 (×3): qty 2

## 2019-12-10 MED ORDER — ALBUTEROL SULFATE HFA 108 (90 BASE) MCG/ACT IN AERS
2.0000 | INHALATION_SPRAY | Freq: Once | RESPIRATORY_TRACT | Status: AC
  Administered 2019-12-10: 2 via RESPIRATORY_TRACT
  Filled 2019-12-10: qty 6.7

## 2019-12-10 NOTE — ED Notes (Signed)
EDP at bedside  

## 2019-12-10 NOTE — ED Provider Notes (Signed)
[facility] EMERGENCY DEPARTMENT Provider Note   CSN: [id number] Arrival date & time: 12/10/19  1430     History Chief Complaint  Patient presents with  . Suicidal  . Addiction Problem  . Depression    Chad Frank is a 59 y.o. male.  HPI      Chad Frank is a 59 y.o. male with past medical history of substance abuse and hypertension who presents to the Emergency Department with police escort at request of the crisis center at [facility].  Patient admits to suicidal thoughts and plan with overdosing on his medications.  He states that was seen by therapists in the past, but no longer sees them.   He reports recent increased stressors and multiple deaths in his family over the last several months.  He states that his worsening depression has caused him to relapse and he began drinking alcohol and using crack cocaine recently. Unknown amt of alcohol.   He last used crack yesterday.  He is requesting assistance with his suicidal feelings and his substance abuse.  He also reports a history of asthma and states that he is noticed some wheezing recently and tightness in his upper chest.  He denies cough or shortness of breath.  He also denies fever, chills, abdominal pain, nausea or vomiting.    Past Medical History:  Diagnosis Date  . Hypertension   . Irregular heart rhythm   . Substance abuse [facility])     Patient Active Problem List   Diagnosis Date Noted  . Severe episode of recurrent major depressive disorder, with psychotic features (HCC)   . Abdominal pain 11/04/2017  . Constipation 11/04/2017  . Chest pain 10/13/2016  . Abnormal EKG 10/13/2016  . HTN (hypertension) 10/13/2016  . Hyperlipidemia 10/13/2016  . Anemia 09/25/2016  . Anxiety 09/25/2016  . Moderate episode of recurrent major depressive disorder (HCC) 05/29/2016  . Laceration of finger with infection 08/14/2015  . Lung nodule 12/18/2014  . Tension type headache 11/15/2014  . Asthma 02/10/2013  . Bronchitis  02/10/2013  . Cocaine-related disorder (HCC) 02/10/2013  . Tobacco abuse 02/10/2013    Past Surgical History:  Procedure Laterality Date  . MANDIBLE FRACTURE SURGERY    . NERVE SURGERY Left        Family History  Problem Relation Age of Onset  . Colon cancer Neg Hx     Social History   Tobacco Use  . Smoking status: Current Every Day Smoker    Packs/day: 0.50    Types: Cigarettes  . Smokeless tobacco: Never Used  Vaping Use  . Vaping Use: Never used  Substance Use Topics  . Alcohol use: Yes    Comment: 1 case every 2-3 days  . Drug use: Yes    Types: Cocaine, "Crack" cocaine    Comment: "crack" used about 09/11/17-Denies any use on 05/07/18, 02/02/19    Home Medications Prior to Admission medications   Medication Sig Start Date End Date Taking? Authorizing Provider  acetaminophen (TYLENOL) 325 MG tablet Take 325 mg by mouth every 6 (six) hours as needed for mild pain or headache.   Yes [provider]  albuterol (VENTOLIN HFA) 108 (90 Base) MCG/ACT inhaler Inhale into the lungs every 6 (six) hours as needed for wheezing or shortness of breath.   Yes [provider]  amLODipine (NORVASC) 5 MG tablet Take 5 mg by mouth daily. 09/25/16  Yes [provider]  atorvastatin (LIPITOR) 40 MG tablet Take 40 mg by mouth daily. 10/10/19  Yes [provider]  FLUoxetine (PROZAC) 20 MG capsule Take 20 mg by mouth daily. 11/21/19  Yes [provider]  gabapentin (NEURONTIN) 300 MG capsule Take 1 capsule (300 mg total) by mouth 3 (three) times daily. 10/05/18  Yes Raeford Razor, MD  traZODone (DESYREL) 100 MG tablet Take 100 mg by mouth daily. 10/10/19  Yes [provider]  albuterol (PROVENTIL HFA;VENTOLIN HFA) 108 (90 Base) MCG/ACT inhaler Inhale 2 puffs into the lungs 2 (two) times daily. 09/25/16 09/25/17  [provider]    Allergies    Bee venom, Other, and Shellfish allergy  Review of Systems   Review of Systems    Constitutional: Negative for chills, fatigue and fever.  HENT: Negative for sore throat and trouble swallowing.   Respiratory: Positive for chest tightness and wheezing. Negative for cough and shortness of breath.   Cardiovascular: Negative for chest pain and palpitations.  Gastrointestinal: Negative for abdominal pain, blood in stool, nausea and vomiting.  Genitourinary: Negative for dysuria, flank pain and hematuria.  Musculoskeletal: Negative for arthralgias, back pain, myalgias, neck pain and neck stiffness.  Skin: Negative for rash.  Neurological: Negative for dizziness, weakness and numbness.  Hematological: Does not bruise/bleed easily.  Psychiatric/Behavioral: Positive for suicidal ideas. Negative for agitation and confusion.    Physical Exam Updated Vital Signs BP 124/83 (BP Location: Right Arm)   Pulse 68   Temp 98.4 F (36.9 C)   Resp 15   Ht 5\' 6"  (1.676 m)   Wt 61.2 kg   SpO2 99%   BMI 21.79 kg/m   Physical Exam Constitutional:      Appearance: Normal appearance. He is not toxic-appearing.  HENT:     Head: Normocephalic.  Eyes:     Pupils: Pupils are equal, round, and reactive to light.  Neck:     Thyroid: No thyromegaly.     Meningeal: Kernig's sign absent.  Cardiovascular:     Rate and Rhythm: Normal rate and regular rhythm.     Pulses: Normal pulses.  Pulmonary:     Effort: Pulmonary effort is normal.     Breath sounds: Normal breath sounds. No wheezing.  Abdominal:     Palpations: Abdomen is soft.     Tenderness: There is no abdominal tenderness. There is no guarding or rebound.  Musculoskeletal:        General: Normal range of motion.     Cervical back: Normal range of motion and neck supple.  Skin:    General: Skin is warm.     Findings: No rash.  Neurological:     General: No focal deficit present.     Mental Status: He is alert and oriented to person, place, and time.     Sensory: No sensory deficit.     Motor: No weakness.  Psychiatric:         Attention and Perception: He does not perceive auditory or visual hallucinations.        Mood and Affect: Mood is depressed. Affect is flat.        Speech: Speech normal.        Behavior: Behavior normal. Behavior is cooperative.        Thought Content: Thought content includes suicidal ideation. Thought content includes suicidal plan.     ED Results / Procedures / Treatments   Labs (all labs ordered are listed, but only abnormal results are displayed) Labs Reviewed  COMPREHENSIVE METABOLIC PANEL - Abnormal; Notable for the following components:  Result Value   Total Protein 8.2 (*)    All other components within normal limits  SALICYLATE LEVEL - Abnormal; Notable for the following components:   Salicylate Lvl <7.0 (*)    All other components within normal limits  ACETAMINOPHEN LEVEL - Abnormal; Notable for the following components:   Acetaminophen (Tylenol), Serum <10 (*)    All other components within normal limits  CBC - Abnormal; Notable for the following components:   RBC 4.21 (*)    MCV 103.3 (*)    All other components within normal limits  RAPID URINE DRUG SCREEN, HOSP PERFORMED - Abnormal; Notable for the following components:   Cocaine POSITIVE (*)    All other components within normal limits  ETHANOL  TROPONIN I (HIGH SENSITIVITY)    EKG EKG Interpretation  Date/Time:  Sunday December 10 2019 14:48:14 EDT Ventricular Rate:  59 PR Interval:  190 QRS Duration: 82 QT Interval:  412 QTC Calculation: 407 R Axis:   85 Text Interpretation: Sinus bradycardia Minimal voltage criteria for LVH, may be normal variant ( Sokolow-Lyon ) ST & T wave abnormality, consider inferolateral ischemia Abnormal ECG T wave inversions in inferior/lateral leads noted on April 2019 ecg, no new changes, No STEMI Confirmed by Alvester Chou [phone]) on 12/10/2019 3:59:00 PM   Radiology No results found.  Procedures Procedures (including critical care time)  Medications Ordered in  ED Medications  albuterol (VENTOLIN HFA) 108 (90 Base) MCG/ACT inhaler 2 puff (2 puffs Inhalation Given 12/10/19 1637)    ED Course  I have reviewed the triage vital signs and the nursing notes.  Pertinent labs & imaging results that were available during my care of the patient were reviewed by me and considered in my medical decision making (see chart for details).    MDM Rules/Calculators/A&P                          Pt here with thoughts of suicide in plan with overdose of his medication.  Recent multiple deaths of several family members.  Patient also has history of substance abuse and admits to cocaine use yesterday.  He has recently been speaking with his counselors and was picked up by police and brought in for evaluation due to concern of crisis center.  Patient had consult with TTS who recommends inpatient treatment.  Patient is in agreement with plan.  He is currently voluntary.  He remains calm and cooperative.  1930  Patient resting comfortably.  Remains cooperative and calm.  waiting placement.  Discussed with Dr Renaye Rakers    Final Clinical Impression(s) / ED Diagnoses Final diagnoses:  Suicidal ideation  Polysubstance abuse [facility])    Rx / DC Orders ED Discharge Orders    None       Rosey Bath 12/10/19 1934    Terald Sleeper, MD 12/11/19 1330
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
## 2019-12-10 NOTE — Progress Notes (Signed)
Per Reola Calkins NP, patient meets criteria for inpatient treatment (gero). No available or appropriate beds at [facility]. CSW faxed referrals to the following facilities for review:  [facility]  TTS will continue to seek bed placement.   Trula Slade, MSW, LCSW Clinical Social Worker 12/10/2019 4:51 PM

## 2019-12-10 NOTE — ED Notes (Addendum)
Pt belongings locked in EMS locker room containing shorts, shirt, and socks. Security given cigaretts, lighter, 2 rubber braceletts, wallet with cards and id, 24 rings, 2 necklaces, and two silver bracelets.

## 2019-12-10 NOTE — ED Triage Notes (Signed)
Patient comes to the ED stating he was brought by police after calling per request of the crisis center therapy from [facility].  Patient told therapist one day ago he was suicidal with plan to take all of his medications at once.  Patient feels too many personal tragedies happened recently and feels there is no hope and would like to end his life.

## 2019-12-11 DIAGNOSIS — F1910 Other psychoactive substance abuse, uncomplicated: Secondary | ICD-10-CM | POA: Diagnosis not present

## 2019-12-11 NOTE — BH Assessment (Addendum)
Received a message from patient's nurse stating that patient would like to speak with someone at [facility]. Clinician agreed to to speak with patient via phone.   He expressed his frustrations with sitting in the hospital in his words "bored". Stated that he has "No one to talk to about his issues". He reports cravings. "I want to smoke and drink". He is also reporting "bad thoughts" and "auditory hallucinations".  Clinician notified NP of patient's reported symptoms.

## 2019-12-11 NOTE — BH Assessment (Addendum)
Patient continues to meet inpatient treatment. Patient referred to the following hospitals for consideration of a bed.  [facility]      [facility]     [facility] Details     [facility] Details     [facility] Details     [facility] Details     [facility] Details     [facility]     [facility] Details     [facility] Details     [facility] Details     [facility] Details     [facility] Details     [facility]

## 2019-12-12 DIAGNOSIS — F1910 Other psychoactive substance abuse, uncomplicated: Secondary | ICD-10-CM | POA: Diagnosis not present

## 2019-12-12 LAB — SARS CORONAVIRUS 2 BY RT PCR (HOSPITAL ORDER, PERFORMED IN ~~LOC~~ HOSPITAL LAB): SARS Coronavirus 2: NEGATIVE

## 2019-12-12 MED ORDER — ACETAMINOPHEN 325 MG PO TABS
650.0000 mg | ORAL_TABLET | Freq: Once | ORAL | Status: AC
  Administered 2019-12-12: 650 mg via ORAL
  Filled 2019-12-12: qty 2

## 2019-12-12 NOTE — ED Notes (Signed)
Pt expressing frustration with the process. Understands the need for placement and that they are trying to find placement. Pt states the frustration makes him want to leave and makes him want to use drugs (crack cocaine) and to drink.

## 2019-12-12 NOTE — ED Notes (Signed)
Pt informed about transfer. Ok with plan.

## 2019-12-12 NOTE — BHH Counselor (Signed)
TTS reassessment: Patient is alert and oriented x 4. He states "I feel angry and frustrated. I've got a lot going on in my head. I hate myself right now." Patient reports his multiple failed attempts at sobriety are triggering him to feel this way. Additionally, he lost 3 family members in the past year. Patient reports staying in the ED without anything to do is making his depression worse, however, he knows he needs to stay and go to treatment. Patient endorses passive SI. He denies HI/AVH at this time.  Patient continues to meet in patient criteria. TTS to continue to seek placement.

## 2019-12-12 NOTE — ED Notes (Signed)
TTS now

## 2019-12-12 NOTE — ED Notes (Signed)
Per Asher Muir at [facility]; ok for pt to come tonight

## 2019-12-12 NOTE — ED Notes (Signed)
Per report from [place], pt is no longer a candidate for [facility] thus he has not left. Called Asher Muir at [facility] for clarification. Pt still has a bed at [facility]. Not sure where that information came from. Pt still has a bed with [facility] they have been waiting for transport during this time.

## 2019-12-12 NOTE — ED Notes (Signed)
In shower now

## 2019-12-12 NOTE — Progress Notes (Signed)
CSW received a call from pt's RN that states she checked with [facility] and they can still take the pt voluntarily.  CSW filled out the [address] [facility] [address] Form, emailed it to [email or web address] and called for transport.  The worker answering calls stated they will staff the request with admin at transportation and will call the CSW back.   CSW awaiting return call from [facility] now.  RN updated and states pt is ready for transport.  CSW will continue to follow for D/C needs.  [facility]. Lewayne Pauley  MSW, LCSW, LCAS, CCS Transitions of Care Clinical Social Worker Care Coordination Department Ph: [phone]

## 2019-12-12 NOTE — ED Notes (Signed)
Per Asher Muir at [facility]-  Dr Loyola Mast accepting pt. Ok to come voluntarily since pt wants to sign self in as long as we can provide safe transport for pt.  Can come when transport is arranged and COVID test done and faxed [phone]) Report to [phone]. Can come any time.  SW order placed to arrange transport. COVID order also placed.

## 2019-12-12 NOTE — ED Provider Notes (Signed)
Received notice from nursing that patient had been accepted to [facility] but they are requesting IVC prior to transfer. Patient is here voluntarily and upon speaking to nursing he has been no trouble and has made no threats or had any abnormal behaviors. He still wants to stay and receive help. I do not think he warrants IVC at this time, will ask nursing to contact [facility] and seek other placement.    Jamilett Ferrante, Barbara Cower, MD 12/12/19 [phone]

## 2019-12-13 DIAGNOSIS — F1910 Other psychoactive substance abuse, uncomplicated: Secondary | ICD-10-CM | POA: Diagnosis not present

## 2019-12-13 MED ORDER — CALCIUM CARBONATE ANTACID 500 MG PO CHEW
1.0000 | CHEWABLE_TABLET | Freq: Once | ORAL | Status: AC
  Administered 2019-12-13: 200 mg via ORAL
  Filled 2019-12-13: qty 1

## 2019-12-13 NOTE — ED Notes (Signed)
Attempt to call report x 1  

## 2019-12-13 NOTE — Progress Notes (Addendum)
CSW called and spoke to [place] in Transportation who stated she is en route now and will be there at the [facility] ED at approx 12:25am to pick up the pt.  Steward Drone was provided with the [facility] ED Secretary's number at ph: [phone]  12:18 AM RN updated.  CSW will continue to follow for D/C needs.  Dorothe Pea. Lavina Resor  MSW, LCSW, LCAS, CCS Transitions of Care Clinical Social Worker Care Coordination Department Ph: [phone]

## 2019-12-13 NOTE — ED Notes (Signed)
Pt depart with safe transport to go to [facility]. Belongings and paperwork with driver

## 2020-01-10 ENCOUNTER — Encounter (HOSPITAL_COMMUNITY): Payer: Self-pay | Admitting: Emergency Medicine

## 2020-01-10 ENCOUNTER — Other Ambulatory Visit: Payer: Self-pay

## 2020-01-10 ENCOUNTER — Emergency Department (HOSPITAL_COMMUNITY)
Admission: EM | Admit: 2020-01-10 | Discharge: 2020-01-12 | Disposition: A | Discharge status: Other Acute Inpatient Hospital | Payer: Medicare Other | Source: Home / Self Care | Attending: Emergency Medicine | Admitting: Emergency Medicine

## 2020-01-10 DIAGNOSIS — R45851 Suicidal ideations: Secondary | ICD-10-CM | POA: Insufficient documentation

## 2020-01-10 DIAGNOSIS — F329 Major depressive disorder, single episode, unspecified: Secondary | ICD-10-CM | POA: Insufficient documentation

## 2020-01-10 DIAGNOSIS — F1494 Cocaine use, unspecified with cocaine-induced mood disorder: Secondary | ICD-10-CM | POA: Insufficient documentation

## 2020-01-10 DIAGNOSIS — Z20822 Contact with and (suspected) exposure to covid-19: Secondary | ICD-10-CM | POA: Insufficient documentation

## 2020-01-10 DIAGNOSIS — J45909 Unspecified asthma, uncomplicated: Secondary | ICD-10-CM | POA: Insufficient documentation

## 2020-01-10 DIAGNOSIS — F32A Depression, unspecified: Secondary | ICD-10-CM

## 2020-01-10 DIAGNOSIS — F1420 Cocaine dependence, uncomplicated: Secondary | ICD-10-CM | POA: Insufficient documentation

## 2020-01-10 DIAGNOSIS — I10 Essential (primary) hypertension: Secondary | ICD-10-CM | POA: Insufficient documentation

## 2020-01-10 DIAGNOSIS — F17210 Nicotine dependence, cigarettes, uncomplicated: Secondary | ICD-10-CM | POA: Insufficient documentation

## 2020-01-10 DIAGNOSIS — F332 Major depressive disorder, recurrent severe without psychotic features: Secondary | ICD-10-CM | POA: Diagnosis not present

## 2020-01-10 DIAGNOSIS — Z79899 Other long term (current) drug therapy: Secondary | ICD-10-CM | POA: Insufficient documentation

## 2020-01-10 DIAGNOSIS — F1290 Cannabis use, unspecified, uncomplicated: Secondary | ICD-10-CM | POA: Insufficient documentation

## 2020-01-10 DIAGNOSIS — F1994 Other psychoactive substance use, unspecified with psychoactive substance-induced mood disorder: Secondary | ICD-10-CM

## 2020-01-10 LAB — COMPREHENSIVE METABOLIC PANEL
ALT: 17 U/L (ref 0–44)
AST: 24 U/L (ref 15–41)
Albumin: 4.7 g/dL (ref 3.5–5.0)
Alkaline Phosphatase: 59 U/L (ref 38–126)
Anion gap: 11 (ref 5–15)
BUN: 22 mg/dL (ref 8–23)
CO2: 24 mmol/L (ref 22–32)
Calcium: 9.6 mg/dL (ref 8.9–10.3)
Chloride: 103 mmol/L (ref 98–111)
Creatinine, Ser: 1.11 mg/dL (ref 0.61–1.24)
GFR calc Af Amer: >60 mL/min (ref >60)
GFR calc non Af Amer: >60 mL/min (ref >60)
Glucose, Bld: 90 mg/dL (ref 70–99)
Potassium: 3.9 mmol/L (ref 3.5–5.1)
Sodium: 138 mmol/L (ref 135–145)
Total Bilirubin: 1.6 mg/dL — ABNORMAL HIGH (ref 0.3–1.2)
Total Protein: 8.1 g/dL (ref 6.5–8.1)

## 2020-01-10 LAB — SALICYLATE LEVEL: Salicylate Lvl: <7 mg/dL — ABNORMAL LOW (ref 7.0–30.0)

## 2020-01-10 LAB — RAPID URINE DRUG SCREEN, HOSP PERFORMED
Amphetamines: NOT DETECTED
Barbiturates: NOT DETECTED
Benzodiazepines: NOT DETECTED
Cocaine: POSITIVE — AB
Opiates: NOT DETECTED
Tetrahydrocannabinol: NOT DETECTED

## 2020-01-10 LAB — CBC
HCT: 43.2 % (ref 39.0–52.0)
Hemoglobin: 13.9 g/dL (ref 13.0–17.0)
MCH: 32.3 pg (ref 26.0–34.0)
MCHC: 32.2 g/dL (ref 30.0–36.0)
MCV: 100.5 fL — ABNORMAL HIGH (ref 80.0–100.0)
Platelets: 234 10*3/uL (ref 150–400)
RBC: 4.3 MIL/uL (ref 4.22–5.81)
RDW: 12.4 % (ref 11.5–15.5)
WBC: 6.1 10*3/uL (ref 4.0–10.5)
nRBC: 0 % (ref 0.0–0.2)

## 2020-01-10 LAB — ACETAMINOPHEN LEVEL: Acetaminophen (Tylenol), Serum: <10 ug/mL — ABNORMAL LOW (ref 10–30)

## 2020-01-10 LAB — ETHANOL: Alcohol, Ethyl (B): <10 mg/dL (ref <10)

## 2020-01-10 MED ORDER — AMLODIPINE BESYLATE 5 MG PO TABS
5.0000 mg | ORAL_TABLET | Freq: Every day | ORAL | Status: DC
  Administered 2020-01-10 – 2020-01-12 (×3): 5 mg via ORAL
  Filled 2020-01-10 (×3): qty 1

## 2020-01-10 MED ORDER — ATORVASTATIN CALCIUM 40 MG PO TABS
40.0000 mg | ORAL_TABLET | Freq: Every day | ORAL | Status: DC
  Administered 2020-01-10 – 2020-01-12 (×3): 40 mg via ORAL
  Filled 2020-01-10 (×3): qty 1

## 2020-01-10 MED ORDER — GABAPENTIN 300 MG PO CAPS
300.0000 mg | ORAL_CAPSULE | Freq: Three times a day (TID) | ORAL | Status: DC
  Administered 2020-01-10 – 2020-01-12 (×8): 300 mg via ORAL
  Filled 2020-01-10 (×8): qty 1

## 2020-01-10 MED ORDER — TRAZODONE HCL 50 MG PO TABS
100.0000 mg | ORAL_TABLET | Freq: Every day | ORAL | Status: DC
  Administered 2020-01-10 – 2020-01-12 (×3): 100 mg via ORAL
  Filled 2020-01-10 (×3): qty 2

## 2020-01-10 MED ORDER — ALBUTEROL SULFATE HFA 108 (90 BASE) MCG/ACT IN AERS: 1.0000 | INHALATION_SPRAY | Freq: Four times a day (QID) | RESPIRATORY_TRACT | Status: DC | PRN

## 2020-01-10 MED ORDER — FLUOXETINE HCL 20 MG PO CAPS
20.0000 mg | ORAL_CAPSULE | Freq: Every day | ORAL | Status: DC
  Administered 2020-01-10 – 2020-01-12 (×3): 20 mg via ORAL
  Filled 2020-01-10 (×3): qty 1

## 2020-01-10 NOTE — ED Provider Notes (Signed)
3:33 PM BP 140/77 (BP Location: Left Arm)   Pulse 78   Temp 98.1 F (36.7 C) (Oral)   Resp 18   Ht 5\' 6"  (1.676 m)   Wt 61 kg   SpO2 98%   BMI 21.71 kg/m  Patient here initially for SI/ Substance abuse. Medically and Psych cleared for OP treatment. Originally plan was to go to rehab in [place], but patient does not meet criteria. Safe for D/C at this time.   [place], PA-C 01/10/20 1535    03/11/20, MD 01/11/20 0000

## 2020-01-10 NOTE — ED Notes (Signed)
Social worker at bedside.

## 2020-01-10 NOTE — ED Notes (Addendum)
Patient ate breakfast and is back in bed resting

## 2020-01-10 NOTE — ED Notes (Signed)
Pt changed into burgundy scrubs and belongings secured in locker room. Clothing, shoes, cell phone, bracelet, inhaler included. Pt informed for need of urine sample.

## 2020-01-10 NOTE — ED Notes (Signed)
Pt agrees at this time to go to rehab in [place]. [facility] aware.

## 2020-01-10 NOTE — ED Provider Notes (Signed)
[facility] EMERGENCY DEPARTMENT Provider Note   CSN: [id number] Arrival date & time: 01/10/20  0536     History   Chad Frank is a 59 y.o. male.  Patient is a 59 year old male with past medical history of hypertension, hyperlipidemia, depression, and cocaine abuse.  He presents today for evaluation of depression.  Patient describes multiple stressors in the past year including the death of multiple family members from Covid and the murder of his brother.  He states he feels hopeless and suicidal.  Patient here requesting help with these feelings.  He does admit to recent substance abuse including cocaine and alcohol.  Patient admitted to behavioral health in July with similar issues.  He does not feel as though he was hospitalized for long enough to make a difference.  The history is provided by the patient.       Past Medical History:  Diagnosis Date  . Hypertension   . Irregular heart rhythm   . Substance abuse [facility])     Patient Active Problem List   Diagnosis Date Noted  . Severe episode of recurrent major depressive disorder, with psychotic features (HCC)   . Abdominal pain 11/04/2017  . Constipation 11/04/2017  . Chest pain 10/13/2016  . Abnormal EKG 10/13/2016  . HTN (hypertension) 10/13/2016  . Hyperlipidemia 10/13/2016  . Anemia 09/25/2016  . Anxiety 09/25/2016  . Moderate episode of recurrent major depressive disorder (HCC) 05/29/2016  . Laceration of finger with infection 08/14/2015  . Lung nodule 12/18/2014  . Tension type headache 11/15/2014  . Asthma 02/10/2013  . Bronchitis 02/10/2013  . Cocaine-related disorder (HCC) 02/10/2013  . Tobacco abuse 02/10/2013    Past Surgical History:  Procedure Laterality Date  . MANDIBLE FRACTURE SURGERY    . NERVE SURGERY Left        Family History  Problem Relation Age of Onset  . Colon cancer Neg Hx     Social History   Tobacco Use  . Smoking status: Current Every Day Smoker    Packs/day: 0.50     Types: Cigarettes  . Smokeless tobacco: Never Used  Vaping Use  . Vaping Use: Never used  Substance Use Topics  . Alcohol use: Yes    Comment: 1 case every 2-3 days  . Drug use: Yes    Types: Cocaine, "Crack" cocaine    Comment: "crack" used about 09/11/17-Denies any use on 05/07/18, 02/02/19    Home Medications Prior to Admission medications   Medication Sig Start Date End Date Taking? Authorizing Provider  acetaminophen (TYLENOL) 325 MG tablet Take 325 mg by mouth every 6 (six) hours as needed for mild pain or headache.    [provider]  albuterol (PROVENTIL HFA;VENTOLIN HFA) 108 (90 Base) MCG/ACT inhaler Inhale 2 puffs into the lungs 2 (two) times daily. 09/25/16 09/25/17  [provider]  albuterol (VENTOLIN HFA) 108 (90 Base) MCG/ACT inhaler Inhale into the lungs every 6 (six) hours as needed for wheezing or shortness of breath.    [provider]  amLODipine (NORVASC) 5 MG tablet Take 5 mg by mouth daily. 09/25/16   [provider]  atorvastatin (LIPITOR) 40 MG tablet Take 40 mg by mouth daily. 10/10/19   [provider]  FLUoxetine (PROZAC) 20 MG capsule Take 20 mg by mouth daily. 11/21/19   [provider]  gabapentin (NEURONTIN) 300 MG capsule Take 1 capsule (300 mg total) by mouth 3 (three) times daily. 10/05/18   Raeford Razor, MD  traZODone (DESYREL) 100  MG tablet Take 100 mg by mouth daily. 10/10/19   [provider]    Allergies    Bee venom, Other, and Shellfish allergy  Review of Systems   Review of Systems  All other systems reviewed and are negative.   Physical Exam Updated Vital Signs BP 140/77 (BP Location: Left Arm)   Pulse 78   Temp 98.1 F (36.7 C) (Oral)   Resp 18   Ht 5\' 6"  (1.676 m)   Wt 61 kg   SpO2 98%   BMI 21.71 kg/m   Physical Exam Vitals and nursing note reviewed.  Constitutional:      General: He is not in acute distress.    Appearance: He is well-developed. He is not diaphoretic.    HENT:     Head: Normocephalic and atraumatic.  Cardiovascular:     Rate and Rhythm: Normal rate and regular rhythm.     Heart sounds: No murmur heard.  No friction rub.  Pulmonary:     Effort: Pulmonary effort is normal. No respiratory distress.     Breath sounds: Normal breath sounds. No wheezing or rales.  Abdominal:     General: Bowel sounds are normal. There is no distension.     Palpations: Abdomen is soft.     Tenderness: There is no abdominal tenderness.  Musculoskeletal:        General: Normal range of motion.     Cervical back: Normal range of motion and neck supple.  Skin:    General: Skin is warm and dry.  Neurological:     Mental Status: He is alert and oriented to person, place, and time.     Coordination: Coordination normal.  Psychiatric:        Attention and Perception: Attention normal.        Mood and Affect: Affect is flat.        Speech: Speech is delayed.        Behavior: Behavior is withdrawn.        Thought Content: Thought content includes suicidal ideation. Thought content does not include homicidal ideation. Thought content does not include homicidal or suicidal plan.        Judgment: Judgment is impulsive.     ED Results / Procedures / Treatments   Labs (all labs ordered are listed, but only abnormal results are displayed) Labs Reviewed  COMPREHENSIVE METABOLIC PANEL  ETHANOL  SALICYLATE LEVEL  ACETAMINOPHEN LEVEL  CBC  RAPID URINE DRUG SCREEN, HOSP PERFORMED    EKG None  Radiology No results found.  Procedures Procedures (including critical care time)  Medications Ordered in ED Medications - No data to display  ED Course  I have reviewed the triage vital signs and the nursing notes.  Pertinent labs & imaging results that were available during my care of the patient were reviewed by me and considered in my medical decision making (see chart for details).    MDM Rules/Calculators/A&P  Patient presenting here with complaints of  depression as described in the HPI.  Patient will undergo medical screening laboratory work and TTS consultation.  Disposition pending.  Care handed off to the oncoming provider at shift change.  Final Clinical Impression(s) / ED Diagnoses Final diagnoses:  None    Rx / DC Orders ED Discharge Orders    None       , MD 01/10/20 [phone]

## 2020-01-10 NOTE — ED Notes (Signed)
Pt jewelry removed and locked with belongings in psych locker.

## 2020-01-10 NOTE — ED Notes (Signed)
Pt on the phone with Chad Frank and states "I need a referral so I can go there." Informed pt he did not meet inpatient criteria and was being discharged home with outpatient resources and substance abuse resources. Pt states "Who makes that decision, Who is to say when I leave here I won't walk out in front of a car in the parking lot." Dr Preston Fleeting informed of pt statements, [facility] assessment. Orders received for consult with psychiatry. Pt becoming increasing agitated with pending discharge.

## 2020-01-10 NOTE — Clinical Social Work Note (Signed)
Transition of Care [facility]) - Emergency Department Mini Assessment  Patient Details  Name: Chad Frank MRN: [id number] Date of Birth: 1960/11/07  Transition of [facility]) CM/SW Contact:    Sherie Don, LCSW Phone Number: 01/10/2020, 3:25 PM  Clinical Narrative: Patient is a 59 year old male who presented to the ED for depression and suicidal thoughts. Patient also reported ETOH and cocaine use. TTS consulted and patient has been psych cleared per [place], Creta Levin. Sula recommended patient be referred to [facility]. CSW met with patient in ED and patient was agreeable to referral, but patient reported he is not homeless.  CSW called [facility] [employer] and was informed it is a Artist that happens to provide some substance use services, however, the patient does not meet criteria and would have been required to complete a face-to-face interview, which would not have guaranteed placement even if he met criteria.  CSW spoke with [facility] supervisor and was informed to provide patient with outpatient substance use services as he is psych cleared and does not meet criteria for inpatient psych placement or [facility] [employer]. CSW provided patient with OP resources. TOC signing off.  ED Mini Assessment: What brought you to the Emergency Department? : Depression, suicidal thoughts Barriers to Discharge: ED Active Substance Abuse, ED Psych evaluation Barrier interventions: OP substance abuse referrals Means of departure: Car Interventions which prevented an admission or readmission: Other (must enter comment) (OP substance abuse resources)  Patient Contact and Communications Key Contact 1: Geologist, engineering Date: 01/10/20     Contact Phone Number: [phone] Call outcome: Patient is not homeless and does not meet criteria Patient states their goals for this hospitalization and ongoing recovery are:: Substance use treatment CMS Medicare.gov Compare Post Acute  Care list provided to:: Patient Choice offered to / list presented to : Patient  Admission diagnosis:  V70.1 Patient Active Problem List   Diagnosis Date Noted  . Cocaine use disorder, severe, dependence ([place])   . Severe episode of recurrent major depressive disorder, with psychotic features ([place])   . Abdominal pain 11/04/2017  . Constipation 11/04/2017  . Chest pain 10/13/2016  . Abnormal EKG 10/13/2016  . HTN (hypertension) 10/13/2016  . Hyperlipidemia 10/13/2016  . Anemia 09/25/2016  . Anxiety 09/25/2016  . Moderate episode of recurrent major depressive disorder ([place]) 05/29/2016  . Laceration of finger with infection 08/14/2015  . Lung nodule 12/18/2014  . Tension type headache 11/15/2014  . Asthma 02/10/2013  . Bronchitis 02/10/2013  . Cocaine-induced mood disorder ([place]) 02/10/2013  . Tobacco abuse 02/10/2013   PCP:  Rosita Fire, MD Pharmacy:   [place], [place] - [address] [place] [address] Phone: [phone] Fax: [phone]
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
## 2020-01-10 NOTE — ED Provider Notes (Signed)
Signout from Dr. Judd Lien.  59 year old male here with depression, suicidal ideation.  Plan is to follow-up on lab work and if no acute findings put patient in for TTS eval. Physical Exam  BP 140/77 (BP Location: Left Arm)   Pulse 78   Temp 98.1 F (36.7 C) (Oral)   Resp 18   Ht 5\' 6"  (1.676 m)   Wt 61 kg   SpO2 98%   BMI 21.71 kg/m   Physical Exam  ED Course/Procedures     Procedures  MDM  Labs showing no acute findings.  Will place in behavioral health hold consult TTS       , MD 01/11/20 [phone]

## 2020-01-10 NOTE — ED Notes (Signed)
Plan of care discussed with patient. Pt more calm at the present time.

## 2020-01-10 NOTE — ED Triage Notes (Signed)
Pt here tonight c/o depression and suicidal thoughts. Pt states that he has had a lot of stressors lately with family member deaths. Pt here recently for the same and was sent for 7 days to another facility. Pt admits to drinks ETOH and smoking crack for the last 3 days.

## 2020-01-10 NOTE — Discharge Instructions (Signed)
Return for any new or worsening sxs

## 2020-01-11 LAB — SARS CORONAVIRUS 2 BY RT PCR (HOSPITAL ORDER, PERFORMED IN ~~LOC~~ HOSPITAL LAB): SARS Coronavirus 2: NEGATIVE

## 2020-01-11 NOTE — Progress Notes (Signed)
Pt meets inpatient criteria per Denzil Magnuson, NP. Referral information has been sent to the following hospitals for review:  [facility]  [facility]  [facility]  [facility]  [facility]  [facility]  [facility]  [facility]      Disposition will continue to assist with inpatient placement needs.   Wells Guiles, LCSW, LCAS Disposition CSW [facility] BHH/TTS [phone]

## 2020-01-11 NOTE — BH Assessment (Signed)
Re-assessment 01/11/2020:   Patient initially  presented noticeably agitated and upset at the beginning of this assessment. He was standing in the door with his arms folded in a defensive manner. When asked why he appeared to be upset, the patient stated "These people telling me I may have to leave and I really need some help".   Patient continues to endorse SI -- with a plan to approach the [facility] outside of his hospital room so "he can end it all right here in the hospital". Patient also endorsed HI towards "anyone who wont help me".   Patient shared that he has experienced several deaths within his family over the last three years, including the death of his 61 yo grandson who states passed away last week.   Patient reports he is very upset because he feels that no one is "trying to help me".   Patient endorsed AVH -- stating he has been having visual hallucinations of "figures" in his room and auditory hallucinations telling him "bad things". The patient states these hallucinations "come and go", however seem to worsen when he becomes agitated or upset.   Patient states that if he is released from the hospital, he plans to "end it all".   Disposition: Per Denzil Magnuson, NP the patient is recommended for inpatient psychiatric services at this time. TTS/Disposition will seek potential placement.      Baldo Daub, MSW, LCSW Clinical Social Worker [facility]

## 2020-01-11 NOTE — ED Notes (Signed)
This nurse spoke to Crystal at [facility] to inquire about Covid test timing. This nurse was advised the Covid test could be performed today.

## 2020-01-11 NOTE — Progress Notes (Signed)
Pt accepted to [facility].    Dr. Othelia Pulling is the accepting/attending provider.    Call report to [phone]  [facility] notified.     Pt is under IVC and will be transported by law enforcement  Pt is scheduled to arrive at [facility] on 01/12/20 at 11am.    Wells Guiles, LCSW, LCAS Disposition CSW [facility] BHH/TTS [phone]

## 2020-01-12 ENCOUNTER — Encounter (HOSPITAL_COMMUNITY): Payer: Self-pay | Admitting: Psychiatry

## 2020-01-12 ENCOUNTER — Inpatient Hospital Stay (HOSPITAL_COMMUNITY)
Admission: AD | Admit: 2020-01-12 | Discharge: 2020-01-17 | DRG: 885 | Disposition: A | Discharge status: Home / Self Care | Payer: Medicare Other | Source: Intra-hospital | Attending: Psychiatry | Admitting: Psychiatry

## 2020-01-12 DIAGNOSIS — Z20822 Contact with and (suspected) exposure to covid-19: Secondary | ICD-10-CM | POA: Diagnosis present

## 2020-01-12 DIAGNOSIS — E7800 Pure hypercholesterolemia, unspecified: Secondary | ICD-10-CM | POA: Diagnosis present

## 2020-01-12 DIAGNOSIS — F1290 Cannabis use, unspecified, uncomplicated: Secondary | ICD-10-CM | POA: Diagnosis present

## 2020-01-12 DIAGNOSIS — F1424 Cocaine dependence with cocaine-induced mood disorder: Secondary | ICD-10-CM | POA: Diagnosis not present

## 2020-01-12 DIAGNOSIS — F1494 Cocaine use, unspecified with cocaine-induced mood disorder: Secondary | ICD-10-CM | POA: Diagnosis present

## 2020-01-12 DIAGNOSIS — G4700 Insomnia, unspecified: Secondary | ICD-10-CM | POA: Diagnosis present

## 2020-01-12 DIAGNOSIS — F17210 Nicotine dependence, cigarettes, uncomplicated: Secondary | ICD-10-CM | POA: Diagnosis present

## 2020-01-12 DIAGNOSIS — I10 Essential (primary) hypertension: Secondary | ICD-10-CM | POA: Diagnosis present

## 2020-01-12 DIAGNOSIS — F1994 Other psychoactive substance use, unspecified with psychoactive substance-induced mood disorder: Secondary | ICD-10-CM | POA: Diagnosis not present

## 2020-01-12 DIAGNOSIS — J45909 Unspecified asthma, uncomplicated: Secondary | ICD-10-CM | POA: Diagnosis present

## 2020-01-12 DIAGNOSIS — F332 Major depressive disorder, recurrent severe without psychotic features: Principal | ICD-10-CM | POA: Diagnosis present

## 2020-01-12 DIAGNOSIS — R45851 Suicidal ideations: Secondary | ICD-10-CM | POA: Diagnosis present

## 2020-01-12 DIAGNOSIS — F1420 Cocaine dependence, uncomplicated: Secondary | ICD-10-CM | POA: Diagnosis not present

## 2020-01-12 DIAGNOSIS — F1024 Alcohol dependence with alcohol-induced mood disorder: Secondary | ICD-10-CM | POA: Diagnosis not present

## 2020-01-12 DIAGNOSIS — E785 Hyperlipidemia, unspecified: Secondary | ICD-10-CM | POA: Diagnosis present

## 2020-01-12 MED ORDER — TRAZODONE HCL 100 MG PO TABS
100.0000 mg | ORAL_TABLET | Freq: Every evening | ORAL | Status: DC | PRN
  Administered 2020-01-12 – 2020-01-14 (×3): 100 mg via ORAL
  Filled 2020-01-12 (×3): qty 1

## 2020-01-12 MED ORDER — GABAPENTIN 300 MG PO CAPS
300.0000 mg | ORAL_CAPSULE | Freq: Three times a day (TID) | ORAL | Status: DC
  Administered 2020-01-12 – 2020-01-13 (×3): 300 mg via ORAL
  Filled 2020-01-12 (×9): qty 1

## 2020-01-12 MED ORDER — FLUOXETINE HCL 20 MG PO CAPS
20.0000 mg | ORAL_CAPSULE | Freq: Every day | ORAL | Status: DC
  Administered 2020-01-13 – 2020-01-17 (×5): 20 mg via ORAL
  Filled 2020-01-12 (×7): qty 1

## 2020-01-12 MED ORDER — HYDROXYZINE HCL 25 MG PO TABS: 25.0000 mg | ORAL_TABLET | Freq: Three times a day (TID) | ORAL | Status: DC | PRN

## 2020-01-12 MED ORDER — ATORVASTATIN CALCIUM 40 MG PO TABS
40.0000 mg | ORAL_TABLET | Freq: Every day | ORAL | Status: DC
  Administered 2020-01-13 – 2020-01-17 (×5): 40 mg via ORAL
  Filled 2020-01-12 (×7): qty 1

## 2020-01-12 MED ORDER — IBUPROFEN 800 MG PO TABS
800.0000 mg | ORAL_TABLET | Freq: Once | ORAL | Status: AC
  Administered 2020-01-12: 800 mg via ORAL
  Filled 2020-01-12: qty 1

## 2020-01-12 MED ORDER — ALBUTEROL SULFATE HFA 108 (90 BASE) MCG/ACT IN AERS: 2.0000 | INHALATION_SPRAY | Freq: Four times a day (QID) | RESPIRATORY_TRACT | Status: DC | PRN

## 2020-01-12 MED ORDER — ACETAMINOPHEN 325 MG PO TABS
650.0000 mg | ORAL_TABLET | Freq: Four times a day (QID) | ORAL | Status: DC | PRN
  Administered 2020-01-14: 650 mg via ORAL
  Filled 2020-01-12: qty 2

## 2020-01-12 MED ORDER — MAGNESIUM HYDROXIDE 400 MG/5ML PO SUSP: 30.0000 mL | Freq: Every day | ORAL | Status: DC | PRN

## 2020-01-12 MED ORDER — AMLODIPINE BESYLATE 5 MG PO TABS
5.0000 mg | ORAL_TABLET | Freq: Every day | ORAL | Status: DC
  Administered 2020-01-13 – 2020-01-17 (×5): 5 mg via ORAL
  Filled 2020-01-12 (×7): qty 1

## 2020-01-12 MED ORDER — ALUM & MAG HYDROXIDE-SIMETH 200-200-20 MG/5ML PO SUSP: 30.0000 mL | ORAL | Status: DC | PRN

## 2020-01-12 NOTE — ED Notes (Signed)
Safe transport notified facility that they can not transport pt to [facility] that the pt would need to be IVC and taken by [place].

## 2020-01-12 NOTE — ED Notes (Signed)
Safe transport called will head this way.

## 2020-01-12 NOTE — ED Notes (Signed)
Spoke with [facility] pt has been accepted to [facility] bed 307-2. Dr. Lucianne Muss accepting. Pt can arrive after 2000. Marcelino Duster RN notified.

## 2020-01-12 NOTE — ED Notes (Signed)
[facility] served pt with IVC paperwork and [employer] called to transport pt to jail for transport to [facility].

## 2020-01-12 NOTE — ED Notes (Signed)
[employer] [facility] called at this time to check on IVC paperwork. Magistrate stated "they will review paperwork when they have a free moment." [facility] representative notified.

## 2020-01-12 NOTE — Progress Notes (Signed)
CSW spoke with Dr Jacqulyn Bath with [facility], who has agreed to place pt under IVC. Nyisha Clippard in admissions at [facility] has been notified of this and states that pt will not lose his bed. Once the IVC has been completed, it will need to be faxed to [facility] at [phone]. Law enforcement will now provide transportation.    Wells Guiles, LCSW, LCAS Disposition CSW [facility] BHH/TTS [phone]

## 2020-01-12 NOTE — ED Provider Notes (Signed)
Emergency Medicine Observation Re-evaluation Note  Chad Frank is a 59 y.o. male, seen on rounds today.  Pt initially presented to the ED for complaints of Suicidal and Drug Problem Currently, the patient is resting comfortably in the ED.  Physical Exam  BP 121/61 (BP Location: Right Arm)   Pulse 60   Temp 97.7 F (36.5 C) (Oral)   Resp 16   Ht 5\' 6"  (1.676 m)   Wt 61 kg   SpO2 98%   BMI 21.71 kg/m  Physical Exam Calm and cooperative.  Even unlabored respirations.   ED Course / MDM  EKG:    I have reviewed the labs performed to date as well as medications administered while in observation.  Recent changes in the last 24 hours include the patient being accepted to with plan to be transported today. Accepting MD is Dr. Alvia Grove.  Plan  Current plan is for transport to [facility] today. Patient is under full IVC at this time.  IVC completed and filed.    [employer], MD 01/13/20 0700

## 2020-01-12 NOTE — ED Notes (Signed)
Pt ambulated to [facility]. Pt belongings given to driver. Pt wearing purple scrubs, flip flops and eye glasses at the time of discharge.

## 2020-01-12 NOTE — Progress Notes (Addendum)
Pt has been accepted to [facility] room 307-2 after 8pm, [facility] ED called, spoke with Deanna and gave updated admission information.  RN to RN report can be called to the adult unit at [phone].

## 2020-01-12 NOTE — Clinical Social Work Note (Signed)
Patient to transfer to [facility] for voluntary treatment. Safe Transport form completed and provided to ED. ED to call Safe Transport. TOC signing off.  Memory Argue, LCSW Transitions of Care Clinical Social Worker [facility] Ph: [phone]

## 2020-01-12 NOTE — ED Notes (Signed)
Safe transport emailed a copy of the [employer].

## 2020-01-12 NOTE — ED Notes (Signed)
Spoke with [facility] IVC paperwork rescinded at this time. According to [facility] a bed is becoming available for pt will call back with a bed number. Marcelino Duster RN and [employer] notified.

## 2020-01-12 NOTE — ED Provider Notes (Signed)
[facility] accepted by [facility] for inpatient psychiatric care by Dr. Hildred Priest.  Patient will be transported by police.  EMTALA completed.   Vanetta Mulders, MD 01/12/20 [phone]

## 2020-01-12 NOTE — ED Notes (Signed)
Update given to Chad Frank at [facility] regarding transportation time of this patient. [phone]

## 2020-01-12 NOTE — ED Notes (Signed)
Pt brought back to the ED do to county not having any transportation for the pt tonight. [facility] called to see what can be done for pt to be transported to [facility].

## 2020-01-12 NOTE — ED Notes (Signed)
Safe transport called to set up transportation at this time. Service Intake Request form given to RN.

## 2020-01-12 NOTE — ED Notes (Signed)
IVC paperwork faxed to magistrate.  

## 2020-01-12 NOTE — ED Notes (Signed)
IVC rescinded at this time. ?

## 2020-01-13 ENCOUNTER — Other Ambulatory Visit: Payer: Self-pay

## 2020-01-13 DIAGNOSIS — F1424 Cocaine dependence with cocaine-induced mood disorder: Secondary | ICD-10-CM

## 2020-01-13 LAB — TSH: TSH: 2.551 u[IU]/mL (ref 0.350–4.500)

## 2020-01-13 LAB — HEMOGLOBIN A1C
Hgb A1c MFr Bld: 4.8 % (ref 4.8–5.6)
Mean Plasma Glucose: 91.06 mg/dL

## 2020-01-13 MED ORDER — GABAPENTIN 100 MG PO CAPS
100.0000 mg | ORAL_CAPSULE | Freq: Three times a day (TID) | ORAL | Status: DC
  Administered 2020-01-13 – 2020-01-14 (×3): 100 mg via ORAL
  Filled 2020-01-13 (×9): qty 1

## 2020-01-13 MED ORDER — ADULT MULTIVITAMIN W/MINERALS CH
1.0000 | ORAL_TABLET | Freq: Every day | ORAL | Status: DC
  Administered 2020-01-13 – 2020-01-17 (×5): 1 via ORAL
  Filled 2020-01-13 (×8): qty 1

## 2020-01-13 MED ORDER — ONDANSETRON 4 MG PO TBDP: 4.0000 mg | ORAL_TABLET | Freq: Four times a day (QID) | ORAL | Status: DC | PRN

## 2020-01-13 MED ORDER — BENZOCAINE 10 % MT GEL: Freq: Three times a day (TID) | OROMUCOSAL | Status: DC | PRN

## 2020-01-13 MED ORDER — LORAZEPAM 1 MG PO TABS: 1.0000 mg | ORAL_TABLET | Freq: Four times a day (QID) | ORAL | Status: DC | PRN

## 2020-01-13 MED ORDER — HYDROXYZINE HCL 25 MG PO TABS
25.0000 mg | ORAL_TABLET | Freq: Four times a day (QID) | ORAL | Status: AC | PRN
  Administered 2020-01-15: 25 mg via ORAL
  Filled 2020-01-13 (×2): qty 1

## 2020-01-13 MED ORDER — NICOTINE 21 MG/24HR TD PT24
21.0000 mg | MEDICATED_PATCH | Freq: Every day | TRANSDERMAL | Status: DC
  Administered 2020-01-14 – 2020-01-16 (×3): 21 mg via TRANSDERMAL
  Filled 2020-01-13 (×6): qty 1

## 2020-01-13 MED ORDER — LOPERAMIDE HCL 2 MG PO CAPS: 2.0000 mg | ORAL_CAPSULE | ORAL | Status: DC | PRN

## 2020-01-13 MED ORDER — THIAMINE HCL 100 MG/ML IJ SOLN
100.0000 mg | Freq: Once | INTRAMUSCULAR | Status: AC
  Administered 2020-01-13: 100 mg via INTRAMUSCULAR
  Filled 2020-01-13: qty 2

## 2020-01-13 MED ORDER — THIAMINE HCL 100 MG PO TABS
100.0000 mg | ORAL_TABLET | Freq: Every day | ORAL | Status: DC
  Administered 2020-01-14 – 2020-01-17 (×4): 100 mg via ORAL
  Filled 2020-01-13 (×6): qty 1

## 2020-01-13 NOTE — Progress Notes (Signed)
[place] NOVEL CORONAVIRUS (COVID-19) DAILY CHECK-OFF SYMPTOMS - answer yes or no to each - every day NO YES  Have you had a fever in the past 24 hours?  . Fever (Temp > 37.80C / 100F) X   Have you had any of these symptoms in the past 24 hours? . New Cough .  Sore Throat  .  Shortness of Breath .  Difficulty Breathing .  Unexplained Body Aches   X   Have you had any one of these symptoms in the past 24 hours not related to allergies?   . Runny Nose .  Nasal Congestion .  Sneezing   X   If you have had runny nose, nasal congestion, sneezing in the past 24 hours, has it worsened?  X   EXPOSURES - check yes or no X   Have you traveled outside the state in the past 14 days?  X   Have you been in contact with someone with a confirmed diagnosis of COVID-19 or PUI in the past 14 days without wearing appropriate PPE?  X   Have you been living in the same home as a person with confirmed diagnosis of COVID-19 or a PUI (household contact)?    X   Have you been diagnosed with COVID-19?    X              What to do next: Answered NO to all: Answered YES to anything:   Proceed with unit schedule Follow the [facility] Inpatient Flowsheet.   

## 2020-01-13 NOTE — Progress Notes (Signed)
Patient ID: Elison Worrel, male   DOB: [birth year], 59 y.o.   MRN: [id number]  Admission Note:  D:59 yr male who presents VC in no acute distress for the treatment of SI and Depression. Pt appears flat and depressed. Pt was calm and cooperative with admission process. Pt presents with passive SI/AVH and contracts for safety upon admission. Pt stated his 4y grandson was injured and had surgery and died from complications and he went on a 4 day binge of cocaine / ETOH. Pt stated he was at [facility] 3 years ago , stayed for 7 days and was D/C to a facility in [place] and stayed there for 2 months. Pt stated he would like to go to a LT Tx facility like that one.    Per Assessment: Patient states that he has experienced several deaths in his family inclusing his only grandson.  He states that he has been so depressed that he has started drinking and using cocaine again and states that he lost five years of sobriety.  Patient states that he is experiencing suicidal thoughts, but he did not identify a plan.  However, he states that he has two prior suicide attempts by overdose and walking into traffic.  Patient states that he went to [facility] last month and he was there for seven days, but states that it was not enough treatment for him and feels like he needs additional treatment inclusing drug rehabilitation.  Patient states that when his grandson recently dies that he started drinking and using again and he states that he has been using on binges ever sine.  Patient states that over the past three days that he used $1500 worth of cocaine and he states that he also drank alcohol and states that he last drank this morning.  However, his BAL was <10.  Patient states that he has been having thoughts of causing bodily harm to others, but he does not have an identified victim.   A: Skin was assessed and found to be clear of any abnormal marks  PT searched and no contraband found, POC and unit policies explained and  understanding verbalized. Consents obtained. Food and fluids offered, and  accepted.   R:Pt had no additional questions or concerns.

## 2020-01-13 NOTE — Progress Notes (Signed)
Patient did not attend Goals and Boundary Setting group.

## 2020-01-13 NOTE — Tx Team (Signed)
Initial Treatment Plan 01/13/2020 1:34 AM [name] KHT:[id number]    PATIENT STRESSORS: Marital or family conflict Medication change or noncompliance   PATIENT STRENGTHS: General fund of knowledge Motivation for treatment/growth   PATIENT IDENTIFIED PROBLEMS: Risk for Suicide  Psychosis  "everything I can possibly work on to live and survive"                 DISCHARGE CRITERIA:  Improved stabilization in mood, thinking, and/or behavior Verbal commitment to aftercare and medication compliance  PRELIMINARY DISCHARGE PLAN: Attend PHP/IOP Attend 12-step recovery group  PATIENT/FAMILY INVOLVEMENT: This treatment plan has been presented to and reviewed with the patient, Chad Frank.  The patient and family have been given the opportunity to ask questions and make suggestions.  Delos Haring, RN 01/13/2020, 1:34 AM
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
## 2020-01-13 NOTE — Progress Notes (Signed)
Adult Psychoeducational Wrap-up Group Note     Group Topic/Focus:  Goals Group:   The focus of this group is to help patients review daily goals and to reflect on how goals were implemented throughout the day and the effect they had. In addition, the goal was to revise goals if needed and to discuss how the patient can incorporate goal setting into their daily lives to aide in recovery.   Participation Level:  Active   Participation Quality:  Appropriate   Affect:  Appropriate   Cognitive:  Appropriate   Insight: Appropriate   Engagement in Group:  Engaged   Modes of Intervention:  Discussion   Additional Comments:  Patient rated today a 8.5 and said that goal was achieved.

## 2020-01-13 NOTE — Progress Notes (Signed)
   01/13/20 2212  COVID-19 Daily Checkoff  Have you had a fever (temp > 37.80C/100F)  in the past 24 hours?  No  If you have had runny nose, nasal congestion, sneezing in the past 24 hours, has it worsened? No  COVID-19 EXPOSURE  Have you traveled outside the state in the past 14 days? No  Have you been in contact with someone with a confirmed diagnosis of COVID-19 or PUI in the past 14 days without wearing appropriate PPE? No  Have you been living in the same home as a person with confirmed diagnosis of COVID-19 or a PUI (household contact)? No  Have you been diagnosed with COVID-19? No

## 2020-01-13 NOTE — Progress Notes (Signed)
D. Pt presents as very depressed, expresses hopelessness due to having lost so many family members.Pt is calm, cooperative and compliant with meds. Per pt's self inventory, pt rated his depression, hopelessness and anxiety a 5/6/9, respectively. Pt endorses intermittent suicidal thoughts with no plan, but agrees to contact staff before acting on any harmful thoughts.  A. Labs and vitals monitored. Pt supported emotionally and encouraged to express concerns and ask questions.  R. Pt remains safe with 15 minute checks. Will continue POC.

## 2020-01-13 NOTE — Progress Notes (Signed)
   01/13/20 2221  Psych Admission Type (Psych Patients Only)  Admission Status Voluntary  Psychosocial Assessment  Patient Complaints Insomnia  Eye Contact Fair  Facial Expression Flat  Affect Appropriate to circumstance  Speech Logical/coherent  Interaction Assertive  Motor Activity Slow  Appearance/Hygiene Improved  Behavior Characteristics Appropriate to situation  Mood Pleasant  Thought Process  Coherency Circumstantial  Content WDL  Delusions None reported or observed  Perception WDL  Hallucination None reported or observed  Judgment Impaired  Confusion None  Danger to Self  Current suicidal ideation? Denies  Self-Injurious Behavior No self-injurious ideation or behavior indicators observed or expressed   Agreement Not to Harm Self Yes  Description of Agreement contract for safety  Danger to Others  Danger to Others None reported or observed

## 2020-01-13 NOTE — H&P (Signed)
Psychiatric Admission Assessment Adult  Patient Identification: Chad Frank MRN:  [id number] Date of Evaluation:  01/13/2020 Chief Complaint:  Depression, " I feel I have no purpose" Principal Diagnosis: MDD , consider Substance Induced Mood Disorder, Cocaine Use Disorder, Alcohol Use Disorder  Diagnosis:   History of Present Illness:  59 year old male . Presented to ED voluntarily on 8/4. He reported depression and suicidal ideations. States he has a history of depression which has been exacerbated by multiple losses of loved ones and particularly after the recent death of his grandson, who died recently . States  father died a few years ago, mother died last year and sister earlier this year ( both of COVID) and a brother was murdered last year. Most recently a grandson died several weeks ago ( hit by a motor vehicle). He endorses neuro-vegetative symptoms as below.  He also reports intermittent  psychotic symptoms, described as below.  States last experienced these 2 days ago.  States he was drinking and using cocaine intermittently but recently " went on a binge", and had been drinking daily and heavily, up to 1/4 gallon of liquor , and using cocaine daily.  Admission UDS positive for cocaine , BAL negative .   Associated Signs/Symptoms: Depression Symptoms:  depressed mood, anhedonia, insomnia, suicidal thoughts with specific plan, anxiety, loss of energy/fatigue, (Hypo) Manic Symptoms:  None noted or expressed  Anxiety Symptoms:  Reports increased anxiety recently  Psychotic Symptoms: states he sometimes hears " voices in my head" telling him " things like go borrow some money". At this time does not appear internally preoccupied. No delusions are expressed at this time.   PTSD Symptoms: Does not endorse  Total Time spent with patient: 45 minutes  Past Psychiatric History: He reports history of depression, which he states is chronic/intermittent but  which he states has been  exacerbated by the loss of loved ones. He reports he has been diagnosed with Bipolar Disorder but currently denies any clear history of mania or hypomania. Describes brief mood swings and a past history of explosiveness.  Reports history of suicide attempt in 2017, by overdosing  He was admitted to [facility] about a month ago for depression. He also reports he has been in residential setting ( [facility] in [place]) in 2017.    Is the patient at risk to self? Yes.    Has the patient been a risk to self in the past 6 months? Yes.    Has the patient been a risk to self within the distant past? Yes.    Is the patient a risk to others? No.  Has the patient been a risk to others in the past 6 months? No.  Has the patient been a risk to others within the distant past? No.   Prior Inpatient Therapy:  yes  Prior Outpatient Therapy:  reports psychiatric medications are prescribed by PCP  Alcohol Screening: 1. How often do you have a drink containing alcohol?: Monthly or less 2. How many drinks containing alcohol do you have on a typical day when you are drinking?: 10 or more 3. How often do you have six or more drinks on one occasion?: Less than monthly AUDIT-C Score: 6 4. How often during the last year have you found that you were not able to stop drinking once you had started?: Less than monthly 5. How often during the last year have you failed to do what was normally expected from you because of drinking?: Never 6.  How often during the last year have you needed a first drink in the morning to get yourself going after a heavy drinking session?: Never 7. How often during the last year have you had a feeling of guilt of remorse after drinking?: Daily or almost daily 8. How often during the last year have you been unable to remember what happened the night before because you had been drinking?: Never 9. Have you or someone else been injured as a result of your drinking?: No 10. Has a relative or  friend or a doctor or another health worker been concerned about your drinking or suggested you cut down?: No Alcohol Use Disorder Identification Test Final Score (AUDIT): 11 Substance Abuse History in the last 12 months: Reports history of cocaine abuse and alcohol abuse. Describes he uses in binges. States "I go for 2-3 weeks and then I get that urge and I relapse for a few days and then I stop again". Denies any other drug use .  Consequences of Substance Abuse: Denies history of WDL seizures, denies history of alcohol related blackouts , no history of DTs.  Previous Psychotropic Medications: Prozac 20 mgrs QDAY, Neurontin 300 mgrs TID, Trazodone 100 mgrs QHS. Reports he has been on these medications for several years, but had not been taking for about a month prior to this admission. Psychological Evaluations: No  Past Medical History: Reports history of HTN. Reports he had not been taking any of his medications over the last month. Home medications- Albuterol inhaler, Norvasc, Lipitor. NKDA.  Past Medical History:  Diagnosis Date  . Hypertension   . Irregular heart rhythm   . Substance abuse [facility])     Past Surgical History:  Procedure Laterality Date  . MANDIBLE FRACTURE SURGERY    . NERVE SURGERY Left    Family History: father died in 2015-10-24 from complications of Alzheimer's Disease . Mother died from COVID last year.  A sister died from COVID earlier this year and a brother was murdered last year.  Family History  Problem Relation Age of Onset  . Colon cancer Neg Hx    Family Psychiatric  History: reports brother had history of depression. No suicides in family . History of alcohol use disorder in extended family.  Tobacco Screening: smokes 1.5 PPD  Social History: divorced, lives alone , reports had an adult daughter who died in a MVA in 2015-10-24. ON disability Social History   Substance and Sexual Activity  Alcohol Use Yes   Comment: 1 case every 2-3 days     Social History    Substance and Sexual Activity  Drug Use Yes  . Types: Cocaine, "Crack" cocaine   Comment: "crack" used about 09/11/17-Denies any use on 05/07/18, 02/02/19    Additional Social History:  Allergies:   Allergies  Allergen Reactions  . Bee Venom   . Other     No opioids or Benzo's pt is in recovery   . Shellfish Allergy    Lab Results:  Results for orders placed or performed during the hospital encounter of 01/12/20 (from the past 48 hour(s))  TSH     Status: None   Collection Time: 01/13/20  6:20 AM  Result Value Ref Range   TSH 2.551 0.350 - 4.500 uIU/mL    Comment: Performed by a 3rd Generation assay with a functional sensitivity of <=0.01 uIU/mL. Performed at [facility], [address] [place], [place] [address]   Hemoglobin A1c     Status: None  Collection Time: 01/13/20  6:20 AM  Result Value Ref Range   Hgb A1c MFr Bld 4.8 4.8 - 5.6 %    Comment: (NOTE) Pre diabetes:          5.7%-6.4%  Diabetes:              >6.4%  Glycemic control for   <7.0% adults with diabetes    Mean Plasma Glucose 91.06 mg/dL    Comment: Performed at [facility], [address] [place], [place] [address]    Blood Alcohol level:  Lab Results  Component Value Date   [facility] <10 01/10/2020   ETH <10 12/10/2019    Metabolic Disorder Labs:  Lab Results  Component Value Date   HGBA1C 4.8 01/13/2020   MPG 91.06 01/13/2020   No results found for: PROLACTIN Lab Results  Component Value Date   CHOL 143 11/28/2019   TRIG 125 11/28/2019   HDL 49 11/28/2019   CHOLHDL 2.9 11/28/2019   VLDL 25 11/28/2019   LDLCALC 69 11/28/2019   LDLCALC 89 06/05/2019    Current Medications: Current Facility-Administered Medications  Medication Dose Route Frequency Provider Last Rate Last Admin  . acetaminophen (TYLENOL) tablet 650 mg  650 mg Oral Q6H PRN Nira Conn A, NP      . albuterol (VENTOLIN HFA) 108 (90 Base) MCG/ACT inhaler 2 puff  2 puff Inhalation Q6H PRN Nira Conn A, NP      . alum & mag hydroxide-simeth (MAALOX/MYLANTA) 200-200-20 MG/5ML suspension 30 mL  30 mL Oral Q4H PRN Nira Conn A, NP      . amLODipine (NORVASC) tablet 5 mg  5 mg Oral Daily Nira Conn A, NP   5 mg at 01/13/20 5188  . atorvastatin (LIPITOR) tablet 40 mg  40 mg Oral Daily Nira Conn A, NP   40 mg at 01/13/20 0838  . benzocaine (ORAJEL) 10 % mucosal gel   Mouth/Throat TID PRN Lorrie Strauch, Rockey Situ, MD   Given at 01/13/20 1357  . FLUoxetine (PROZAC) capsule 20 mg  20 mg Oral Daily Nira Conn A, NP   20 mg at 01/13/20 4166  . gabapentin (NEURONTIN) capsule 300 mg  300 mg Oral TID Nira Conn A, NP   300 mg at 01/13/20 1226  . hydrOXYzine (ATARAX/VISTARIL) tablet 25 mg  25 mg Oral TID PRN Nira Conn A, NP      . magnesium hydroxide (MILK OF MAGNESIA) suspension 30 mL  30 mL Oral Daily PRN Nira Conn A, NP      . traZODone (DESYREL) tablet 100 mg  100 mg Oral QHS PRN Nira Conn A, NP   100 mg at 01/12/20 2227   PTA Medications: Medications Prior to Admission  Medication Sig Dispense Refill Last Dose  . acetaminophen (TYLENOL) 325 MG tablet Take 325 mg by mouth every 6 (six) hours as needed for mild pain or headache.     . albuterol (PROVENTIL HFA;VENTOLIN HFA) 108 (90 Base) MCG/ACT inhaler Inhale 2 puffs into the lungs 2 (two) times daily.     Marland Kitchen albuterol (VENTOLIN HFA) 108 (90 Base) MCG/ACT inhaler Inhale into the lungs every 6 (six) hours as needed for wheezing or shortness of breath.     Marland Kitchen amLODipine (NORVASC) 5 MG tablet Take 5 mg by mouth daily.     Marland Kitchen atorvastatin (LIPITOR) 40 MG tablet Take 40 mg by mouth daily.     Marland Kitchen FLUoxetine (PROZAC) 20 MG capsule Take 20 mg by mouth daily.     Marland Kitchen  gabapentin (NEURONTIN) 300 MG capsule Take 1 capsule (300 mg total) by mouth 3 (three) times daily. 30 capsule 0   . traZODone (DESYREL) 100 MG tablet Take 100 mg by mouth daily.       Musculoskeletal: Strength & Muscle Tone: within normal limits no tremors, no diaphoresis, no  restlessness or psychomotor agitation - vitals 135/63, pulse 52  Gait & Station: normal Patient leans: N/A  Psychiatric Specialty Exam: Physical Exam  Review of Systems  Constitutional: Negative.   HENT: Negative.   Eyes: Negative.   Respiratory: Negative.  Negative for cough and shortness of breath.   Cardiovascular: Negative.  Negative for chest pain.  Gastrointestinal: Negative.  Negative for nausea and vomiting.  Endocrine: Negative.   Genitourinary: Negative.   Musculoskeletal: Negative.   Skin: Negative for rash.  Allergic/Immunologic: Negative.   Neurological: Positive for headaches. Negative for seizures.  Hematological: Negative.   Psychiatric/Behavioral: Positive for suicidal ideas.       Substance abuse      Blood pressure 135/63, pulse (!) 52, temperature 97.7 F (36.5 C), temperature source Oral, resp. rate 16, height  (1.702 m), weight 63.5 kg, SpO2 99 %.Body mass index is 21.93 kg/m.  General Appearance: Fairly Groomed  Eye Contact:  Good  Speech:  Normal Rate  Volume:  Normal  Mood:  Depressed  Affect:  vaguely dysphoric, anxious, tends to improve during session  Thought Process:  Linear and Descriptions of Associations: Circumstantial  Orientation:  Full (Time, Place, and Person)  Thought Content:  denies hallucinations, no delusions expressed, not internally preoccupied   Suicidal Thoughts:  No denies suicidal or self injurious ideations and contracts for safety on unit, denies homicidal or violent ideations.   Homicidal Thoughts:  No  Memory:  recent and remote fair   Judgement:  Fair  Insight:  Fair  Psychomotor Activity:  Normal no tremors , no diaphoresis, no restlessness or psychomotor agitation at this tim.  Concentration:  Concentration: Fair and Attention Span: Fair  Recall:  Good  Fund of Knowledge:  Good  Language:  Good  Akathisia:  Negative  Handed:  Right  AIMS (if indicated):     Assets:  Communication Skills Desire for  Improvement Resilience  ADL's:  Intact  Cognition:  WNL  Sleep:  Number of Hours: 6    Treatment Plan Summary: Daily contact with patient to assess and evaluate symptoms and progress in treatment, Medication management, Plan inpatient treatment  and medications as below  Observation Level/Precautions:  15 minute checks  Laboratory:  As needed   Psychotherapy:  Milieu, group therapy   Medications:  He was restarted on home medication regimen, which he states he was tolerating well , but which he had stopped a few weeks ago. Neurontin - restart at 100 mgrs TID, titrate as tolerated  Prozac 20 mgrs QDAY Trazodone 100 mgrs QHS PRN for insomnia  He was also restarted on Albuterol inhaler PRN, Lipitor, Norvasc for HTN.   Currently does not present with alcohol WDL symptoms, vitals are stable and admission BAL was negative. Will add Ativan PRN for alcohol WDL if needed, thiamine and MVI supplementation.  Consultations:  As needed    Discharge Concerns:  - patient is expressing interest in going to a rehab at discharge -  Estimated LOS: 4-5 days   Other:     Physician Treatment Plan for Primary Diagnosis: Depression- MDD versus Substance Induced Mood Disorder Long Term Goal(s): Improvement in symptoms so as ready for discharge  Short Term Goals: Ability to identify changes in lifestyle to reduce recurrence of condition will improve, Ability to verbalize feelings will improve, Ability to disclose and discuss suicidal ideas, Ability to demonstrate self-control will improve, Ability to identify and develop effective coping behaviors will improve, Ability to maintain clinical measurements within normal limits will improve and Compliance with prescribed medications will improve  Physician Treatment Plan for Secondary Diagnosis: Cocaine /Alcohol Use Disorder by history Long Term Goal(s): Improvement in symptoms so as ready for discharge  Short Term Goals: Ability to identify changes in lifestyle  to reduce recurrence of condition will improve and Ability to identify triggers associated with substance abuse/mental health issues will improve  I certify that inpatient services furnished can reasonably be expected to improve the patient's condition.    Craige Cotta, MD 8/7/20212:42 PM

## 2020-01-14 MED ORDER — GABAPENTIN 100 MG PO CAPS
200.0000 mg | ORAL_CAPSULE | Freq: Three times a day (TID) | ORAL | Status: DC
  Administered 2020-01-14 – 2020-01-17 (×9): 200 mg via ORAL
  Filled 2020-01-14 (×14): qty 2

## 2020-01-14 NOTE — Progress Notes (Signed)
   01/14/20 2149  Psych Admission Type (Psych Patients Only)  Admission Status Voluntary  Psychosocial Assessment  Patient Complaints None  Eye Contact Fair  Facial Expression Flat  Affect Appropriate to circumstance  Speech Logical/coherent  Interaction Assertive  Motor Activity Slow  Appearance/Hygiene Improved  Aggressive Behavior  Effect No apparent injury  Thought Process  Coherency WDL  Content WDL  Delusions None reported or observed  Perception WDL  Hallucination None reported or observed  Judgment Impaired  Confusion None  Danger to Self  Current suicidal ideation? Denies  Self-Injurious Behavior No self-injurious ideation or behavior indicators observed or expressed   Agreement Not to Harm Self Yes  Description of Agreement contract for safety  Danger to Others  Danger to Others None reported or observed

## 2020-01-14 NOTE — Progress Notes (Signed)
[facility] MD Progress Note  01/14/2020 12:25 PM Chad Frank  MRN:  [id number] Subjective: Today patient reports still feeling depressed but acknowledges  some improvement. He is vaguely irritable. States he is irritated because " some people were rude". Presents future oriented and interested in disposition planning, stating he wants to go to a rehab at discharge. He states that in the past he went to [facility] , a residential sober program in [place], which he felt was helpful and which he liked. He states he would consider returning there if it is an option for him. Denies medication side effects. Denies SI. Reports fair sleep  Objective : I have reviewed case with treatment team and have met with patient. 59 year old male, presented to ED on 8/4 reporting depression, suicidal ideations, neuro-vegetative symptoms. History of substance use disorder ( alcohol and cocaine in binge use pattern). Reports he had relapsed 4 days prior to admission and had been using cocaine and alcohol daily. Admission UDS (+) for cocaine, BAL negative. Reports history of depression, which he characterizes as chronic but exacerbated by multiple losses/death of loved ones . States parents , siblings have died over the last 1-2 years and that a grandson died recently ( struck by a vehicle) .  Today patient presents alert, attentive, without psychomotor agitation or restlessness . States he still feels depressed and continues to grieve death of loved ones, particularly recent loss of grandson.  He does acknowledge feeling better than he did on admission and at this time denies suicidal ideations and presents future oriented .  Vaguely irritable and focusing on some staff being " rude": reports  that he had wanted to have conversation with CSW about his discharge plans and that he was told that CSW had come to see him but that he was asleep at the time. Reports " I was not asleep , don't tell me I was asleep when I know I was  awake" He responds well to support , empathic listening . Affect tended to improve during session.  He presents future oriented and is focusing on disposition planning, hoping to go to a rehab setting. Thus far tolerating medications well . He is not presenting with symptoms of WDL at this time- no tremors, no diaphoresis, no restlessness or psychomotor agitation. Labs reviewed- HgbA1C 4.8, TSH 2.55    Principal Problem:  MDD versus Substance Induced Mood Disorder. Cocaine/ Alcohol Use Disorder  Diagnosis: Active Problems:   Severe recurrent major depression without psychotic features ([place])  Total Time spent with patient: 20 minutes  Past Psychiatric History:   Past Medical History:  Past Medical History:  Diagnosis Date  . Hypertension   . Irregular heart rhythm   . Substance abuse [facility])     Past Surgical History:  Procedure Laterality Date  . MANDIBLE FRACTURE SURGERY    . NERVE SURGERY Left    Family History:  Family History  Problem Relation Age of Onset  . Colon cancer Neg Hx    Family Psychiatric  History:  Social History:  Social History   Substance and Sexual Activity  Alcohol Use Yes   Comment: 1 case every 2-3 days     Social History   Substance and Sexual Activity  Drug Use Yes  . Types: Cocaine, "Crack" cocaine   Comment: "crack" used about 09/11/17-Denies any use on 05/07/18, 02/02/19    Social History   Socioeconomic History  . Marital status: Divorced    Spouse name: Not on file  .  Number of children: Not on file  . Years of education: Not on file  . Highest education level: Not on file  Occupational History  . Not on file  Tobacco Use  . Smoking status: Current Every Day Smoker    Packs/day: 1.00    Years: 45.00    Pack years: 45.00    Types: Cigarettes  . Smokeless tobacco: Never Used  Vaping Use  . Vaping Use: Never used  Substance and Sexual Activity  . Alcohol use: Yes    Comment: 1 case every 2-3 days  . Drug use: Yes    Types:  Cocaine, "Crack" cocaine    Comment: "crack" used about 09/11/17-Denies any use on 05/07/18, 02/02/19  . Sexual activity: Yes    Birth control/protection: Condom  Other Topics Concern  . Not on file  Social History Narrative  . Not on file   Social Determinants of Health   Financial Resource Strain:   . Difficulty of Paying Living Expenses:   Food Insecurity:   . Worried About Charity fundraiser in the Last Year:   . Arboriculturist in the Last Year:   Transportation Needs:   . Film/video editor (Medical):   Marland Kitchen Lack of Transportation (Non-Medical):   Physical Activity:   . Days of Exercise per Week:   . Minutes of Exercise per Session:   Stress:   . Feeling of Stress :   Social Connections:   . Frequency of Communication with Friends and Family:   . Frequency of Social Gatherings with Friends and Family:   . Attends Religious Services:   . Active Member of Clubs or Organizations:   . Attends Archivist Meetings:   Marland Kitchen Marital Status:    Additional Social History:   Sleep: Fair  Appetite:  improving  Current Medications: Current Facility-Administered Medications  Medication Dose Route Frequency Provider Last Rate Last Admin  . acetaminophen (TYLENOL) tablet 650 mg  650 mg Oral Q6H PRN Lindon Romp A, NP      . albuterol (VENTOLIN HFA) 108 (90 Base) MCG/ACT inhaler 2 puff  2 puff Inhalation Q6H PRN Lindon Romp A, NP      . amLODipine (NORVASC) tablet 5 mg  5 mg Oral Daily Lindon Romp A, NP   5 mg at 01/14/20 0758  . atorvastatin (LIPITOR) tablet 40 mg  40 mg Oral Daily Lindon Romp A, NP   40 mg at 01/14/20 0758  . benzocaine (ORAJEL) 10 % mucosal gel   Mouth/Throat TID PRN Briley Sulton, Myer Peer, MD   Given at 01/13/20 1357  . FLUoxetine (PROZAC) capsule 20 mg  20 mg Oral Daily Lindon Romp A, NP   20 mg at 01/14/20 0757  . gabapentin (NEURONTIN) capsule 100 mg  100 mg Oral TID Jo-Anne Kluth, Myer Peer, MD   100 mg at 01/14/20 1151  . hydrOXYzine (ATARAX/VISTARIL)  tablet 25 mg  25 mg Oral Q6H PRN Amariyah Bazar, Myer Peer, MD      . LORazepam (ATIVAN) tablet 1 mg  1 mg Oral Q6H PRN Daris Aristizabal, Myer Peer, MD      . multivitamin with minerals tablet 1 tablet  1 tablet Oral Daily Pearlene Teat, Myer Peer, MD   1 tablet at 01/14/20 0758  . nicotine (NICODERM CQ - dosed in mg/24 hours) patch 21 mg  21 mg Transdermal Daily Alichia Alridge, Myer Peer, MD   21 mg at 01/14/20 0800  . thiamine tablet 100 mg  100 mg Oral Daily Adon Gehlhausen, Felicita Gage  A, MD   100 mg at 01/14/20 0758  . traZODone (DESYREL) tablet 100 mg  100 mg Oral QHS PRN Lindon Romp A, NP   100 mg at 01/13/20 2128    Lab Results:  Results for orders placed or performed during the hospital encounter of 01/12/20 (from the past 48 hour(s))  TSH     Status: None   Collection Time: 01/13/20  6:20 AM  Result Value Ref Range   TSH 2.551 0.350 - 4.500 uIU/mL    Comment: Performed by a 3rd Generation assay with a functional sensitivity of <=0.01 uIU/mL. Performed at [facility], [place] [address] [place], [place] [address]   Hemoglobin A1c     Status: None   Collection Time: 01/13/20  6:20 AM  Result Value Ref Range   Hgb A1c MFr Bld 4.8 4.8 - 5.6 %    Comment: (NOTE) Pre diabetes:          5.7%-6.4%  Diabetes:              >6.4%  Glycemic control for   <7.0% adults with diabetes    Mean Plasma Glucose 91.06 mg/dL    Comment: Performed at [facility] [address], [place], [place] [address]    Blood Alcohol level:  Lab Results  Component Value Date   ETH <10 01/10/2020   ETH <10 50/93/2671    Metabolic Disorder Labs: Lab Results  Component Value Date   HGBA1C 4.8 01/13/2020   MPG 91.06 01/13/2020   No results found for: PROLACTIN Lab Results  Component Value Date   CHOL 143 11/28/2019   TRIG 125 11/28/2019   HDL 49 11/28/2019   CHOLHDL 2.9 11/28/2019   VLDL 25 11/28/2019   LDLCALC 69 11/28/2019   LDLCALC 89 06/05/2019    Physical Findings: AIMS: Facial and Oral  Movements Muscles of Facial Expression: None, normal Lips and Perioral Area: None, normal Jaw: None, normal Tongue: None, normal,Extremity Movements Upper (arms, wrists, hands, fingers): None, normal Lower (legs, knees, ankles, toes): None, normal, Trunk Movements Neck, shoulders, hips: None, normal, Overall Severity Severity of abnormal movements (highest score from questions above): None, normal Incapacitation due to abnormal movements: None, normal Patient's awareness of abnormal movements (rate only patient's report): No Awareness, Dental Status Current problems with teeth and/or dentures?: No Does patient usually wear dentures?: Yes  CIWA:  CIWA-Ar Total: 1 COWS:  COWS Total Score: 1  Musculoskeletal: Strength & Muscle Tone: within normal limits Gait & Station: normal Patient leans: N/A  Psychiatric Specialty Exam: Physical Exam  Review of Systems no fever, no chills, no dyspnea, no shortness of breath, no vomiting   Blood pressure 113/62, pulse (!) 58, temperature 97.9 F (36.6 C), temperature source Oral, resp. rate 16, height 5' 7"  (1.702 m), weight 63.5 kg, SpO2 99 %.Body mass index is 21.93 kg/m.  General Appearance: Casual  Eye Contact:  Fair, improves as session progresses   Speech:  Normal Rate  Volume:  Normal  Mood:  remains depressed and vaguely irritable, reports feeling better than on admission  Affect:  Congruent  Thought Process:  Linear and Descriptions of Associations: Intact  Orientation:  Full (Time, Place, and Person)  Thought Content:  no hallucinations,no delusions , not internally preoccupied   Suicidal Thoughts:  No no suicidal ideations and presents future oriented, contracts for safety on unit   Homicidal Thoughts:  No  Memory:  recent and remote grossly intact   Judgement:  Fair/ improving  Insight:  Fair  Psychomotor Activity:  Normal- no tremors, no diaphoresis, no restlessness , no psychomotor agitation or restlessness   Concentration:   Concentration: Good and Attention Span: Good  Recall:  Good  Fund of Knowledge:  Good  Language:  Good  Akathisia:  Negative  Handed:  Right  AIMS (if indicated):     Assets:  Communication Skills Desire for Improvement Resilience  ADL's:  Intact  Cognition:  WNL  Sleep:  Number of Hours: 5   Assessment -  59 year old male, presented to ED on 8/4 reporting depression, suicidal ideations, neuro-vegetative symptoms. History of substance use disorder ( alcohol and cocaine in binge use pattern). Reports he had relapsed 4 days prior to admission and had been using cocaine and alcohol daily. Admission UDS (+) for cocaine, BAL negative. Reports history of depression, which he characterizes as chronic but exacerbated by multiple losses/death of loved ones . States parents , siblings have died over the last 1-2 years and that a grandson died recently ( struck by a vehicle) .  Today patient reports feeling better/partial improvement compared to admission , although remains vaguely depressed/irritable. Today he denies suicidal ideations and presents future oriented , focusing on disposition planning and in particular wanting to go to a rehab setting after discharge. States that he has been to [facility] in [place] in the past and would like to consider returning there if it is an option for him. Denies medication side effects . Currently not presenting with symptoms of alcohol WDL- presents calm and in no acute distress or discomfort . Treatment Plan Summary: Daily contact with patient to assess and evaluate symptoms and progress in treatment, Medication management, Plan inpatient treatment  and medications as below Encourage group and milieu participation Encourage efforts to work on sobriety and relapse prevention Treatment team working on disposition planning options- as above, expressing interest in going to a rehab  Continue Prozac 20 mgrs QDAY for depression Increase Neurontin to 200 mgrs TID for  anxiety, pain Continue Ativan 1 mgr Q 6 hours  PRN for alcohol WDL symptoms if needed Continue Lipitor 40 mgrs QDAY for hypercholesterolemia Continue Amlodipine 5 mgr QDAY for HTN ( *BP today 113/62)  Continue Trazodone 100 mgrs QHS PRN for insomnia as needed Jenne Campus, MD 01/14/2020, 12:26 PM

## 2020-01-14 NOTE — Progress Notes (Signed)
D. Pt still presents as depressed, and mildly irritable at times, reporting poor sleep last night despite having received sleep medication. Per pt's self inventory, pt rated his depression, hopelessness and anxiety a 6/5/7, respectively.Pt currently denies SI/HI and AVH .  A. Labs and vitals monitored. Pt administered scheduled  medications and given Tylenol for headache, pain 7/10. Pt supported emotionally and encouraged to express concerns and ask questions.   R. Pt remains safe with 15 minute checks. Will continue POC.

## 2020-01-14 NOTE — Progress Notes (Signed)
[place] NOVEL CORONAVIRUS (COVID-19) DAILY CHECK-OFF SYMPTOMS - answer yes or no to each - every day NO YES  Have you had a fever in the past 24 hours?  . Fever (Temp > 37.80C / 100F) X   Have you had any of these symptoms in the past 24 hours? . New Cough .  Sore Throat  .  Shortness of Breath .  Difficulty Breathing .  Unexplained Body Aches   X   Have you had any one of these symptoms in the past 24 hours not related to allergies?   . Runny Nose .  Nasal Congestion .  Sneezing   X   If you have had runny nose, nasal congestion, sneezing in the past 24 hours, has it worsened?  X   EXPOSURES - check yes or no X   Have you traveled outside the state in the past 14 days?  X   Have you been in contact with someone with a confirmed diagnosis of COVID-19 or PUI in the past 14 days without wearing appropriate PPE?  X   Have you been living in the same home as a person with confirmed diagnosis of COVID-19 or a PUI (household contact)?    X   Have you been diagnosed with COVID-19?    X              What to do next: Answered NO to all: Answered YES to anything:   Proceed with unit schedule Follow the [facility] Inpatient Flowsheet.   

## 2020-01-15 MED ORDER — TRAZODONE HCL 150 MG PO TABS
150.0000 mg | ORAL_TABLET | Freq: Every evening | ORAL | Status: DC | PRN
  Administered 2020-01-15 – 2020-01-16 (×2): 150 mg via ORAL
  Filled 2020-01-15 (×2): qty 1

## 2020-01-15 NOTE — Progress Notes (Signed)
   01/15/20 2100  Psych Admission Type (Psych Patients Only)  Admission Status Voluntary  Psychosocial Assessment  Patient Complaints Anxiety  Eye Contact Fair  Facial Expression Flat  Affect Appropriate to circumstance  Speech Logical/coherent  Interaction Assertive  Motor Activity Slow  Appearance/Hygiene Improved  Behavior Characteristics Anxious  Mood Depressed  Aggressive Behavior  Effect No apparent injury  Thought Process  Coherency WDL  Content WDL  Delusions None reported or observed  Perception WDL  Hallucination None reported or observed  Judgment Impaired  Confusion None  Danger to Self  Current suicidal ideation? Denies  Self-Injurious Behavior No self-injurious ideation or behavior indicators observed or expressed   Agreement Not to Harm Self Yes  Description of Agreement contract for safety  Danger to Others  Danger to Others None reported or observed

## 2020-01-15 NOTE — Plan of Care (Signed)
Nurse discussed anxiety, depression and coping skills with patient.  

## 2020-01-15 NOTE — [facility] Counselor (Signed)
CSW faxed referral to [facility] in [place]. CSW is informed that pt has a medicare advantage plan and they currently do not have any beds available for his insurance.

## 2020-01-15 NOTE — Tx Team (Cosign Needed)
Interdisciplinary Treatment and Diagnostic Plan Update  01/15/2020 Time of Session: 311p Chad Frank MRN: [id number]  Principal Diagnosis: <principal problem not specified>  Secondary Diagnoses: Active Problems:   Severe recurrent major depression without psychotic features ([place])   Current Medications:  Current Facility-Administered Medications  Medication Dose Route Frequency Provider Last Rate Last Admin  . acetaminophen (TYLENOL) tablet 650 mg  650 mg Oral Q6H PRN Lindon Romp A, NP   650 mg at 01/14/20 1235  . albuterol (VENTOLIN HFA) 108 (90 Base) MCG/ACT inhaler 2 puff  2 puff Inhalation Q6H PRN Lindon Romp A, NP      . amLODipine (NORVASC) tablet 5 mg  5 mg Oral Daily Lindon Romp A, NP   5 mg at 01/15/20 0816  . atorvastatin (LIPITOR) tablet 40 mg  40 mg Oral Daily Lindon Romp A, NP   40 mg at 01/15/20 0817  . benzocaine (ORAJEL) 10 % mucosal gel   Mouth/Throat TID PRN Cobos, Myer Peer, MD   Given at 01/13/20 1357  . FLUoxetine (PROZAC) capsule 20 mg  20 mg Oral Daily Lindon Romp A, NP   20 mg at 01/15/20 0817  . gabapentin (NEURONTIN) capsule 200 mg  200 mg Oral TID Cobos, Myer Peer, MD   200 mg at 01/15/20 1214  . hydrOXYzine (ATARAX/VISTARIL) tablet 25 mg  25 mg Oral Q6H PRN Cobos, Myer Peer, MD      . LORazepam (ATIVAN) tablet 1 mg  1 mg Oral Q6H PRN Cobos, Myer Peer, MD      . multivitamin with minerals tablet 1 tablet  1 tablet Oral Daily Cobos, Myer Peer, MD   1 tablet at 01/15/20 0817  . nicotine (NICODERM CQ - dosed in mg/24 hours) patch 21 mg  21 mg Transdermal Daily Cobos, Myer Peer, MD   21 mg at 01/15/20 0818  . thiamine tablet 100 mg  100 mg Oral Daily Cobos, Myer Peer, MD   100 mg at 01/15/20 0817  . traZODone (DESYREL) tablet 100 mg  100 mg Oral QHS PRN Lindon Romp A, NP   100 mg at 01/14/20 2106   PTA Medications: Medications Prior to Admission  Medication Sig Dispense Refill Last Dose  . acetaminophen (TYLENOL) 325 MG tablet Take 325 mg by mouth  every 6 (six) hours as needed for mild pain or headache.     . albuterol (PROVENTIL HFA;VENTOLIN HFA) 108 (90 Base) MCG/ACT inhaler Inhale 2 puffs into the lungs 2 (two) times daily.     Marland Kitchen albuterol (VENTOLIN HFA) 108 (90 Base) MCG/ACT inhaler Inhale into the lungs every 6 (six) hours as needed for wheezing or shortness of breath.     Marland Kitchen amLODipine (NORVASC) 5 MG tablet Take 5 mg by mouth daily.     Marland Kitchen atorvastatin (LIPITOR) 40 MG tablet Take 40 mg by mouth daily.     Marland Kitchen FLUoxetine (PROZAC) 20 MG capsule Take 20 mg by mouth daily.     Marland Kitchen gabapentin (NEURONTIN) 300 MG capsule Take 1 capsule (300 mg total) by mouth 3 (three) times daily. 30 capsule 0   . traZODone (DESYREL) 100 MG tablet Take 100 mg by mouth daily.       Patient Stressors: Marital or family conflict Medication change or noncompliance  Patient Strengths: Technical sales engineer for treatment/growth  Treatment Modalities: Medication Management, Group therapy, Case management,  1 to 1 session with clinician, Psychoeducation, Recreational therapy.   Physician Treatment Plan for Primary Diagnosis: <principal problem not specified> Long Term  Goal(s): Improvement in symptoms so as ready for discharge Improvement in symptoms so as ready for discharge   Short Term Goals: Ability to identify changes in lifestyle to reduce recurrence of condition will improve Ability to verbalize feelings will improve Ability to disclose and discuss suicidal ideas Ability to demonstrate self-control will improve Ability to identify and develop effective coping behaviors will improve Ability to maintain clinical measurements within normal limits will improve Compliance with prescribed medications will improve Ability to identify changes in lifestyle to reduce recurrence of condition will improve Ability to identify triggers associated with substance abuse/mental health issues will improve  Medication Management: Evaluate patient's  response, side effects, and tolerance of medication regimen.  Therapeutic Interventions: 1 to 1 sessions, Unit Group sessions and Medication administration.  Evaluation of Outcomes: Not Met  Physician Treatment Plan for Secondary Diagnosis: Active Problems:   Severe recurrent major depression without psychotic features ([place])  Long Term Goal(s): Improvement in symptoms so as ready for discharge Improvement in symptoms so as ready for discharge   Short Term Goals: Ability to identify changes in lifestyle to reduce recurrence of condition will improve Ability to verbalize feelings will improve Ability to disclose and discuss suicidal ideas Ability to demonstrate self-control will improve Ability to identify and develop effective coping behaviors will improve Ability to maintain clinical measurements within normal limits will improve Compliance with prescribed medications will improve Ability to identify changes in lifestyle to reduce recurrence of condition will improve Ability to identify triggers associated with substance abuse/mental health issues will improve     Medication Management: Evaluate patient's response, side effects, and tolerance of medication regimen.  Therapeutic Interventions: 1 to 1 sessions, Unit Group sessions and Medication administration.  Evaluation of Outcomes: Not Met   RN Treatment Plan for Primary Diagnosis: <principal problem not specified> Long Term Goal(s): Knowledge of disease and therapeutic regimen to maintain health will improve  Short Term Goals: Ability to remain free from injury will improve, Ability to verbalize frustration and anger appropriately will improve, Ability to demonstrate self-control, Ability to participate in decision making will improve, Ability to verbalize feelings will improve, Ability to disclose and discuss suicidal ideas, Ability to identify and develop effective coping behaviors will improve and Compliance with prescribed  medications will improve  Medication Management: RN will administer medications as ordered by provider, will assess and evaluate patient's response and provide education to patient for prescribed medication. RN will report any adverse and/or side effects to prescribing provider.  Therapeutic Interventions: 1 on 1 counseling sessions, Psychoeducation, Medication administration, Evaluate responses to treatment, Monitor vital signs and CBGs as ordered, Perform/monitor CIWA, COWS, AIMS and Fall Risk screenings as ordered, Perform wound care treatments as ordered.  Evaluation of Outcomes: Not Met   LCSW Treatment Plan for Primary Diagnosis: <principal problem not specified> Long Term Goal(s): Safe transition to appropriate next level of care at discharge, Engage patient in therapeutic group addressing interpersonal concerns.  Short Term Goals: Engage patient in aftercare planning with referrals and resources, Increase social support, Increase ability to appropriately verbalize feelings, Increase emotional regulation, Facilitate acceptance of mental health diagnosis and concerns, Facilitate patient progression through stages of change regarding substance use diagnoses and concerns, Identify triggers associated with mental health/substance abuse issues and Increase skills for wellness and recovery  Therapeutic Interventions: Assess for all discharge needs, 1 to 1 time with Social worker, Explore available resources and support systems, Assess for adequacy in community support network, Educate family and significant other(s) on suicide prevention,  Complete Psychosocial Assessment, Interpersonal group therapy.  Evaluation of Outcomes: Not Met   Progress in Treatment: Attending groups: No. Participating in groups: Yes. Taking medication as prescribed: Yes. Toleration medication: Yes. Family/Significant other contact made: No, will contact:  pts housing manager Patient understands diagnosis:  Yes. Discussing patient identified problems/goals with staff: No. Medical problems stabilized or resolved: Yes. Denies suicidal/homicidal ideation: Yes. Issues/concerns per patient self-inventory: No. Other:  New problem(s) identified: No, Describe:  none  New Short Term/Long Term Goal(s):  Patient Goals:  Pt did not attend   Discharge Plan or Barriers:   Reason for Continuation of Hospitalization: Depression Medication stabilization  Estimated Length of Stay: 3-5 days  Attendees: Patient: pt did not attend 01/15/2020 3:11 PM  Physician:  01/15/2020 3:11 PM  Nursing:  01/15/2020 3:11 PM  RN Care Manager: 01/15/2020 3:11 PM  Social Worker: Macon Large 01/15/2020 3:11 PM  Recreational Therapist:  01/15/2020 3:11 PM  Other:  01/15/2020 3:11 PM  Other:  01/15/2020 3:11 PM  Other: 01/15/2020 3:11 PM    Scribe for Treatment Team: Bethann Berkshire, LCSW 01/15/2020 3:11 PM
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
## 2020-01-15 NOTE — Progress Notes (Signed)
D:  Patient's self inventory sheet, patient has poor sleep, sleep medication not helpful.  Fair appetite, low energy level, poor concentration.  Rate depression 5, hopeless 4, anxiety 7.  Denied withdrawals.  Denied SI.  Denied physical problems, then checked headaches.  Goal is talk to MD.  Plans to do what he needs to do today.  No discharge plans. A:  Medications administered per MD orders.  Emotional support and encouragement given patient. R:  Denied SI and HI, contracts for safety.  Denied A/V hallucinations.  Safety maintained with 15 minute checks.

## 2020-01-15 NOTE — BHH Group Notes (Signed)
Occupational Therapy Group Note Date: 01/15/2020 Group Topic/Focus: Life Skills/Feelings Management  Group Description: Group encouraged increased participation and engagement through discussion/worksheet focused on "Breaking Down Barriers". Patients were encouraged to complete a worksheet that identified barriers to seeking treatment, asking for help, and engaging in mental health services. Discussion focused on first identifying these barriers and then brainstorming strategies to confront/challenge identified barriers.  Participation Level: Active   Participation Quality: Independent   Behavior: Calm, Cooperative and Interactive   Speech/Thought Process: Focused   Affect/Mood: Full range   Insight: Fair   Judgement: Fair   Individualization: Juluis was active in his participation of discussion and identified "pride" and "stuffing feelings" as barriers that get in the way of his mental health treatment. He shared that it makes him angry when people tell him that they understand what he has been through or empathize, noting "They don't understand because they never been homeless and living on the streets." He shared that he needs to acknowledge he needs help and asked for additional resources/supports post hospitalization noting "Being here for 5 days isn't going to just fix what's going on in my head."  Modes of Intervention: Activity, Discussion and Education  Patient Response to Interventions:  Engaged, Receptive and Interested   Plan: Continue to engage patient in OT groups 2 - 3x/week.  Donne Hazel, MOT, OTR/L

## 2020-01-15 NOTE — Progress Notes (Signed)
Recreation Therapy Notes  Date:  8.9.21 Time: 0930 Location: [address]  Group Topic: Stress Management  Goal Area(s) Addresses:  Patient will identify positive stress management techniques. Patient will identify benefits of using stress management post d/c.  Intervention: Stress Management  Activity: Meditation.  LRT played a meditation that focused on having gratitude for people in your life who are supportive.  Patients were to listen and follow along as meditation played to engage in activity.   Education:  Stress Management, Discharge Planning.   Education Outcome: Acknowledges Education  Clinical Observations/Feedback: Pt did not attend group activity.    Caroll Rancher, LRT/CTRS         Caroll Rancher A 01/15/2020 10:53 AM
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
## 2020-01-16 DIAGNOSIS — F332 Major depressive disorder, recurrent severe without psychotic features: Principal | ICD-10-CM

## 2020-01-16 DIAGNOSIS — F1420 Cocaine dependence, uncomplicated: Secondary | ICD-10-CM

## 2020-01-16 DIAGNOSIS — F1994 Other psychoactive substance use, unspecified with psychoactive substance-induced mood disorder: Secondary | ICD-10-CM

## 2020-01-16 DIAGNOSIS — F1024 Alcohol dependence with alcohol-induced mood disorder: Secondary | ICD-10-CM

## 2020-01-16 MED ORDER — ACAMPROSATE CALCIUM 333 MG PO TBEC
333.0000 mg | DELAYED_RELEASE_TABLET | Freq: Three times a day (TID) | ORAL | Status: DC
  Administered 2020-01-16 – 2020-01-17 (×4): 333 mg via ORAL
  Filled 2020-01-16 (×9): qty 1

## 2020-01-16 MED ORDER — HYDROXYZINE HCL 25 MG PO TABS
25.0000 mg | ORAL_TABLET | Freq: Four times a day (QID) | ORAL | Status: DC | PRN
  Administered 2020-01-16: 25 mg via ORAL

## 2020-01-16 NOTE — Progress Notes (Signed)
   01/16/20 2100  Psych Admission Type (Psych Patients Only)  Admission Status Voluntary  Psychosocial Assessment  Patient Complaints Anxiety  Eye Contact Fair  Facial Expression Anxious  Affect Appropriate to circumstance  Speech Logical/coherent  Interaction Assertive  Motor Activity Slow  Appearance/Hygiene Unremarkable  Behavior Characteristics Anxious  Mood Anxious  Aggressive Behavior  Effect No apparent injury  Thought Process  Coherency WDL  Content WDL  Delusions None reported or observed  Perception WDL  Hallucination None reported or observed  Judgment Impaired  Confusion None  Danger to Self  Current suicidal ideation? Denies  Self-Injurious Behavior No self-injurious ideation or behavior indicators observed or expressed   Agreement Not to Harm Self Yes  Description of Agreement contract for safety  Danger to Others  Danger to Others None reported or observed

## 2020-01-16 NOTE — Progress Notes (Signed)
Recreation Therapy Notes  Animal-Assisted Activity (AAA) Program Checklist/Progress Notes Patient Eligibility Criteria Checklist & Daily Group note for Rec Tx Intervention  Date: 8.10.21 Time: 1430 Location: [address]   AAA/T Program Assumption of Risk Form signed by Engineer, production or Parent Legal Guardian  YES  Patient is free of allergies or sever asthma  YES  Patient reports no fear of animals  YES  Patient reports no history of cruelty to animals YES   Patient understands his/her participation is voluntary YES   Patient washes hands before animal contact  YES   Patient washes hands after animal contact  YES  Behavioral Response: Engaged  Education: Charity fundraiser, Appropriate Animal Interaction   Education Outcome: Acknowledges understanding/In group clarification offered/Needs additional education.   Clinical Observations/Feedback: Pt attended and participated in activity.     Caroll Rancher, LRT/CTRS         Caroll Rancher A 01/16/2020 3:25 PM

## 2020-01-16 NOTE — Progress Notes (Signed)
Pt denies SI/HI/AVH.  Pt  has been calm and cooperative this shift.  Pt has been on the phone talking to [facility] treatment center staff members about pt possibly being admitted there.  Pt asked this Clinical research associate to talk to admission nurse at [facility] treatment center because admission nurse wanted to know what medications pt was currently taking. RN provided information on pt's current medications.  RN worked on Pharmacologist with pt and assessed for need/concerns.  RN administered medications to pt per provider orders.  RN actively listened to pt discuss pt's feelings.  Pt safety is maintained through q 15 min checks. RN will provide support and assist as needed.

## 2020-01-16 NOTE — Progress Notes (Signed)
[facility]  01/16/2020 11:07 AM Chad RoyalsJames Frank  MRN:  [id number] Subjective: Patient is a 59 year old male with a reported past psychiatric history of depression, alcohol dependence and cocaine use disorder who was admitted on 01/13/2020 secondary to worsening depression and suicidal ideation.  Objective: Patient is seen and examined.  Patient is a 59 year old male with the above-stated past psychiatric history who is seen in follow-up.  He stated he is doing relatively well.  He stated he is a bit dismayed over the fact that he has not been accepted into a substance rehabilitation program at this point.  The places that were available do not accept his insurance.  Social work stated that they are working on alternatives.  He denied any suicidal or homicidal ideation.  He stated he was anxious over the fact that he had not been accepted any place.  We discussed the fact that social work did send his application to other facilities and were hopeful.  We discussed alternatives to where he would be discharged to if he was unable to be accepted into a program right away.  He stated that the manager of his apartment complex is apparently coming over to have him sign something so he would be able to leave his apartment.  He stated that he was gone 1 to 2 months he would not be able to hold onto that apartment.  I recommended that he hold off on signing anything until he knows for sure what his disposition will be.  He denied any side effects to his current medications.  He denied any suicidal or homicidal ideation.  His vital signs are stable, he is afebrile.  His most recent CIWA this morning was 2.  Review of his admission laboratories revealed an MCV of 100.5, but was otherwise normal.  Drug screen was positive for cocaine.  Blood alcohol was less than 10.  Liver function enzymes were normal.  He slept 5 hours last night.  Principal Problem: <principal problem not specified> Diagnosis: Active Problems:    Severe recurrent major depression without psychotic features (HCC)  Total Time spent with patient: 20 minutes  Past Psychiatric History: See admission H&P  Past Medical History:  Past Medical History:  Diagnosis Date  . Hypertension   . Irregular heart rhythm   . Substance abuse [facility](HCC)     Past Surgical History:  Procedure Laterality Date  . MANDIBLE FRACTURE SURGERY    . NERVE SURGERY Left    Family History:  Family History  Problem Relation Age of Onset  . Colon cancer Neg Hx    Family Psychiatric  History: See admission H&P Social History:  Social History   Substance and Sexual Activity  Alcohol Use Yes   Comment: 1 case every 2-3 days     Social History   Substance and Sexual Activity  Drug Use Yes  . Types: Cocaine, "Crack" cocaine   Comment: "crack" used about 09/11/17-Denies any use on 05/07/18, 02/02/19    Social History   Socioeconomic History  . Marital status: Divorced    Spouse name: Not on file  . Number of children: Not on file  . Years of education: Not on file  . Highest education level: Not on file  Occupational History  . Not on file  Tobacco Use  . Smoking status: Current Every Day Smoker    Packs/day: 1.00    Years: 45.00    Pack years: 45.00    Types: Cigarettes  . Smokeless tobacco: Never  Used  Vaping Use  . Vaping Use: Never used  Substance and Sexual Activity  . Alcohol use: Yes    Comment: 1 case every 2-3 days  . Drug use: Yes    Types: Cocaine, "Crack" cocaine    Comment: "crack" used about 09/11/17-Denies any use on 05/07/18, 02/02/19  . Sexual activity: Yes    Birth control/protection: Condom  Other Topics Concern  . Not on file  Social History Narrative  . Not on file   Social Determinants of Health   Financial Resource Strain:   . Difficulty of Paying Living Expenses:   Food Insecurity:   . Worried About Programme researcher, broadcasting/film/video in the Last Year:   . Barista in the Last Year:   Transportation Needs:   . Automotive engineer (Medical):   Marland Kitchen Lack of Transportation (Non-Medical):   Physical Activity:   . Days of Exercise per Week:   . Minutes of Exercise per Session:   Stress:   . Feeling of Stress :   Social Connections:   . Frequency of Communication with Friends and Family:   . Frequency of Social Gatherings with Friends and Family:   . Attends Religious Services:   . Active Member of Clubs or Organizations:   . Attends Banker Meetings:   Marland Kitchen Marital Status:    Additional Social History:                         Sleep: Fair  Appetite:  Good  Current Medications: Current Facility-Administered Medications  Medication Dose Route Frequency Provider Last Rate Last Admin  . acamprosate (CAMPRAL) tablet 333 mg  333 mg Oral TID WC Aldean Baker, NP      . acetaminophen (TYLENOL) tablet 650 mg  650 mg Oral Q6H PRN Nira Conn A, NP   650 mg at 01/14/20 1235  . albuterol (VENTOLIN HFA) 108 (90 Base) MCG/ACT inhaler 2 puff  2 puff Inhalation Q6H PRN Nira Conn A, NP      . amLODipine (NORVASC) tablet 5 mg  5 mg Oral Daily Nira Conn A, NP   5 mg at 01/16/20 0913  . atorvastatin (LIPITOR) tablet 40 mg  40 mg Oral Daily Nira Conn A, NP   40 mg at 01/16/20 0913  . benzocaine (ORAJEL) 10 % mucosal gel   Mouth/Throat TID PRN Cobos, Rockey Situ, MD   Given at 01/13/20 1357  . FLUoxetine (PROZAC) capsule 20 mg  20 mg Oral Daily Nira Conn A, NP   20 mg at 01/16/20 0913  . gabapentin (NEURONTIN) capsule 200 mg  200 mg Oral TID Cobos, Rockey Situ, MD   200 mg at 01/16/20 0912  . hydrOXYzine (ATARAX/VISTARIL) tablet 25 mg  25 mg Oral Q6H PRN Cobos, Rockey Situ, MD   25 mg at 01/15/20 2124  . multivitamin with minerals tablet 1 tablet  1 tablet Oral Daily Cobos, Rockey Situ, MD   1 tablet at 01/16/20 0913  . nicotine (NICODERM CQ - dosed in mg/24 hours) patch 21 mg  21 mg Transdermal Daily Cobos, Rockey Situ, MD   21 mg at 01/16/20 0913  . thiamine tablet 100 mg  100 mg Oral Daily  Cobos, Rockey Situ, MD   100 mg at 01/16/20 0913  . traZODone (DESYREL) tablet 150 mg  150 mg Oral QHS PRN Aldean Baker, NP   150 mg at 01/15/20 2124    Lab Results: No  results found for this or any previous visit (from the past 48 hour(s)).  Blood Alcohol level:  Lab Results  Component Value Date   ETH <10 01/10/2020   ETH <10 12/10/2019    Metabolic Disorder Labs: Lab Results  Component Value Date   HGBA1C 4.8 01/13/2020   MPG 91.06 01/13/2020   No results found for: PROLACTIN Lab Results  Component Value Date   CHOL 143 11/28/2019   TRIG 125 11/28/2019   HDL 49 11/28/2019   CHOLHDL 2.9 11/28/2019   VLDL 25 11/28/2019   LDLCALC 69 11/28/2019   LDLCALC 89 06/05/2019    Physical Findings: AIMS: Facial and Oral Movements Muscles of Facial Expression: None, normal Lips and Perioral Area: None, normal Jaw: None, normal Tongue: None, normal,Extremity Movements Upper (arms, wrists, hands, fingers): None, normal Lower (legs, knees, ankles, toes): None, normal, Trunk Movements Neck, shoulders, hips: None, normal, Overall Severity Severity of abnormal movements (highest score from questions above): None, normal Incapacitation due to abnormal movements: None, normal Patient's awareness of abnormal movements (rate only patient's report): No Awareness, Dental Status Current problems with teeth and/or dentures?: No Does patient usually wear dentures?: Yes  CIWA:  CIWA-Ar Total: 2 COWS:  COWS Total Score: 1  Musculoskeletal: Strength & Muscle Tone: within normal limits Gait & Station: normal Patient leans: N/A  Psychiatric Specialty Exam: Physical Exam Vitals and nursing note reviewed.  Constitutional:      Appearance: Normal appearance.  HENT:     Head: Normocephalic and atraumatic.  Pulmonary:     Effort: Pulmonary effort is normal.  Neurological:     General: No focal deficit present.     Mental Status: He is alert.     Review of Systems  Blood pressure (!)  108/59, pulse 64, temperature 98.2 F (36.8 C), temperature source Oral, resp. rate 16, height 5\' 7"  (1.702 m), weight 63.5 kg, SpO2 99 %.Body mass index is 21.93 kg/m.  General Appearance: Casual  Eye Contact:  Fair  Speech:  Normal Rate  Volume:  Normal  Mood:  Anxious  Affect:  Congruent  Thought Process:  Coherent and Descriptions of Associations: Intact  Orientation:  Full (Time, Place, and Person)  Thought Content:  Logical  Suicidal Thoughts:  No  Homicidal Thoughts:  No  Memory:  Immediate;   Fair Recent;   Fair Remote;   Fair  Judgement:  Intact  Insight:  Fair  Psychomotor Activity:  Increased  Concentration:  Concentration: Fair and Attention Span: Fair  Recall:  of Knowledge:  Fair  Language:  Good  Akathisia:  Negative  Handed:  Right  AIMS (if indicated):     Assets:  Desire for Improvement Resilience  ADL's:  Intact  Cognition:  WNL  Sleep:  Number of Hours: 5     Treatment Plan Summary: Daily contact with patient to assess and evaluate symptoms and progress in treatment, Medication management and Plan : Patient is seen and examined.  Patient is a 59 year old male with the above-stated past psychiatric history who is seen in follow-up.   Diagnosis: 1.  Substance-induced mood disorder versus major depression. 2.  Alcohol dependence. 3.  Cocaine use disorder. 4.  Insomnia. 5.  Hyperlipidemia. 6.  Hypertension  Pertinent findings on examination today: 1.  Unfortunately has not been accepted into a residential substance abuse treatment program at this point. 2.  His alcohol withdrawal symptoms are essentially negative. 3.  Pertinent anxiety and depression secondary to the above  Plan: 1.  Stop lorazepam. 2.  Continue albuterol HFA as needed for wheezing and shortness of breath. 3.  Continue amlodipine 5 mg p.o. daily for hypertension. 4.  Continue Lipitor 40 mg p.o. daily for hyperlipidemia. 5.  Continue Orajel as needed for mouth pain or  gum soreness. 6.  Continue fluoxetine 20 mg p.o. daily for anxiety and depression. 7.  Increase gabapentin to 300 mg p.o. 3 times daily for anxiety and chronic pain issues. 8.  Continue hydroxyzine 25 mg p.o. every 6 hours as needed anxiety. 9.  Continue multivitamin with minerals 1 tablet p.o. daily for nutritional supplementation. 10.  Continue thiamine 100 mg p.o. daily for nutritional supplementation. 11.  Increase trazodone to 200 mg p.o. nightly as needed insomnia. 12.  Continue to assist or placement in a residential substance abuse treatment program. 13.  Disposition planning-progress.  Antonieta Pert, MD 01/16/2020, 11:07 AM

## 2020-01-16 NOTE — Progress Notes (Signed)
Pt stated he was happy to be going to Treatment center tomorrow. Pt stated his manager has to get him to sign some papers before he leaves and he needs to return his keys.

## 2020-01-16 NOTE — BHH Counselor (Signed)
Pt denied at [facility] due to not taking [facility] medicare.   CSW contacted [facility] and faxed clinicals to [phone].  Pt is instructed to call Madison in admissions at [phone] for pt interview.

## 2020-01-16 NOTE — BHH Counselor (Signed)
CSW confirmed with Madison at [facility] that pt has bed on 01/17/20. They said they will transport pt to [facility] and arrive at [facility] around 430/5pm. Wyn Forster will call to confirm time in AM on 01/17/20.

## 2020-01-17 MED ORDER — ACAMPROSATE CALCIUM 333 MG PO TBEC: 333.0000 mg | DELAYED_RELEASE_TABLET | Freq: Three times a day (TID) | ORAL | 0 refills | Status: AC

## 2020-01-17 MED ORDER — FLUOXETINE HCL 20 MG PO CAPS: 20.0000 mg | ORAL_CAPSULE | Freq: Every day | ORAL | 0 refills | Status: AC

## 2020-01-17 MED ORDER — BENZOCAINE 10 % MT GEL: Freq: Three times a day (TID) | OROMUCOSAL | 0 refills | Status: AC | PRN

## 2020-01-17 MED ORDER — HYDROXYZINE HCL 25 MG PO TABS: 25.0000 mg | ORAL_TABLET | Freq: Four times a day (QID) | ORAL | 0 refills | Status: AC | PRN

## 2020-01-17 MED ORDER — NICOTINE 21 MG/24HR TD PT24: 21.0000 mg | MEDICATED_PATCH | Freq: Every day | TRANSDERMAL | 0 refills | Status: AC

## 2020-01-17 MED ORDER — GABAPENTIN 100 MG PO CAPS: 200.0000 mg | ORAL_CAPSULE | Freq: Three times a day (TID) | ORAL | 0 refills | Status: AC

## 2020-01-17 MED ORDER — TRAZODONE HCL 150 MG PO TABS: 150.0000 mg | ORAL_TABLET | Freq: Every evening | ORAL | 0 refills | Status: AC | PRN

## 2020-01-17 NOTE — Progress Notes (Signed)
Pt discharged to lobby. Pt was stable and appreciative at that time. All papers and prescriptions were given and valuables returned. Verbal understanding expressed. Denies SI/HI and A/VH. Pt given opportunity to express concerns and ask questions.  

## 2020-01-17 NOTE — Progress Notes (Signed)
Adult Psychoeducational Group Note  Date:  01/17/2020 Time:  12:21 AM  Group Topic/Focus:  Wrap-Up Group:   The focus of this group is to help patients review their daily goal of treatment and discuss progress on daily workbooks.  Participation Level:  Active  Participation Quality:  Appropriate  Affect:  Appropriate  Cognitive:  Alert  Insight: Improving  Engagement in Group:  Developing/Improving  Modes of Intervention:  Education and Support  Additional Comments:  Pt. fully took part in the group activities and was very excited to be part of it. Chad Frank, says that originally, he is unforgiven person. " I do not like to argue, with people, I prefer to get rid of you than arguing with you when I have a  problem with you" " This has become worst after a series of deaths in the family, from parents, siblings to grandson" He says that he does not trust anyone out there anymore. " we had a bitter fight over my mom's estate when she passed away so I don't trust anyone anymore" " I need more time to recover, one week is not enough for me here"  " I do not even want to return to where I came from" " I gave up my apartment".  Seanna Sisler  Lanice Shirts 01/17/2020, 12:21 AM

## 2020-01-17 NOTE — [facility] Suicide Risk Assessment (Signed)
[facility] Discharge Suicide Risk Assessment   Principal Problem: <principal problem not specified> Discharge Diagnoses: Active Problems:   Severe recurrent major depression without psychotic features (HCC)   Total Time spent with patient: 20 minutes  Musculoskeletal: Strength & Muscle Tone: within normal limits Gait & Station: normal Patient leans: N/A  Psychiatric Specialty Exam: Review of Systems  All other systems reviewed and are negative.   Blood pressure (!) 103/59, pulse 75, temperature 97.9 F (36.6 C), temperature source Oral, resp. rate 18, height 5\' 7"  (1.702 m), weight 63.5 kg, SpO2 100 %.Body mass index is 21.93 kg/m.  General Appearance: Casual  Eye Contact::  Good  Speech:  Normal Rate409  Volume:  Normal  Mood:  Euthymic  Affect:  Congruent  Thought Process:  Coherent and Descriptions of Associations: Intact  Orientation:  Full (Time, Place, and Person)  Thought Content:  Logical  Suicidal Thoughts:  No  Homicidal Thoughts:  No  Memory:  Immediate;   Fair Recent;   Fair Remote;   Fair  Judgement:  Intact  Insight:  Fair  Psychomotor Activity:  Normal  Concentration:  Good  Recall:  Good  Fund of Knowledge:Good  Language: Good  Akathisia:  Negative  Handed:  Right  AIMS (if indicated):     Assets:  Desire for Improvement Resilience  Sleep:  Number of Hours: 5  Cognition: WNL  ADL's:  Intact   Mental Status Per Nursing Assessment::   On Admission:  Suicidal ideation indicated by patient, Plan to harm others, Suicide plan  Demographic Factors:  Male, Divorced or widowed, Low socioeconomic status, Living alone and Unemployed  Loss Factors: Financial problems/change in socioeconomic status  Historical Factors: Impulsivity  Risk Reduction Factors:   Positive coping skills or problem solving skills  Continued Clinical Symptoms:  Depression:   Comorbid alcohol abuse/dependence Impulsivity Alcohol/Substance Abuse/Dependencies  Cognitive  Features That Contribute To Risk:  None    Suicide Risk:  Minimal: No identifiable suicidal ideation.  Patients presenting with no risk factors but with morbid ruminations; may be classified as minimal risk based on the severity of the depressive symptoms    Plan Of Care/Follow-up recommendations:  Activity:  ad lib  [phone], MD 01/17/2020, 9:13 AM

## 2020-01-17 NOTE — Discharge Summary (Signed)
Physician Discharge Summary Note  Patient:  Chad Frank is an 59 y.o., male  MRN:  [id number]  DOB:  [birth year]  Patient phone:  [phone] (home)   Patient address:   [address] [place] [address],   Total Time spent with patient: Greater than 30 minutes  Date of Admission:  01/12/2020  Date of Discharge: 01-17-20  Reason for Admission: Worsening symptoms of depression triggering suicidal ideations.  Principal Problem: Severe recurrent major depression without psychotic features [facility])  Discharge Diagnoses: Principal Problem:   Severe recurrent major depression without psychotic features (HCC)  Past Psychiatric History: Severe, recurrent major depression without psychosis.  Past Medical History:  Past Medical History:  Diagnosis Date  . Hypertension   . Irregular heart rhythm   . Substance abuse [facility])     Past Surgical History:  Procedure Laterality Date  . MANDIBLE FRACTURE SURGERY    . NERVE SURGERY Left    Family History:  Family History  Problem Relation Age of Onset  . Colon cancer Neg Hx    Family Psychiatric  History: See H&P  Social History:  Social History   Substance and Sexual Activity  Alcohol Use Yes   Comment: 1 case every 2-3 days     Social History   Substance and Sexual Activity  Drug Use Yes  . Types: Cocaine, "Crack" cocaine   Comment: "crack" used about 09/11/17-Denies any use on 05/07/18, 02/02/19    Social History   Socioeconomic History  . Marital status: Divorced    Spouse name: Not on file  . Number of children: Not on file  . Years of education: Not on file  . Highest education level: Not on file  Occupational History  . Not on file  Tobacco Use  . Smoking status: Current Every Day Smoker    Packs/day: 1.00    Years: 45.00    Pack years: 45.00    Types: Cigarettes  . Smokeless tobacco: Never Used  Vaping Use  . Vaping Use: Never used  Substance and Sexual Activity  . Alcohol use: Yes    Comment: 1  case every 2-3 days  . Drug use: Yes    Types: Cocaine, "Crack" cocaine    Comment: "crack" used about 09/11/17-Denies any use on 05/07/18, 02/02/19  . Sexual activity: Yes    Birth control/protection: Condom  Other Topics Concern  . Not on file  Social History Narrative  . Not on file   Social Determinants of Health   Financial Resource Strain:   . Difficulty of Paying Living Expenses:   Food Insecurity:   . Worried About Programme researcher, broadcasting/film/video in the Last Year:   . Barista in the Last Year:   Transportation Needs:   . Freight forwarder (Medical):   Marland Kitchen Lack of Transportation (Non-Medical):   Physical Activity:   . Days of Exercise per Week:   . Minutes of Exercise per Session:   Stress:   . Feeling of Stress :   Social Connections:   . Frequency of Communication with Friends and Family:   . Frequency of Social Gatherings with Friends and Family:   . Attends Religious Services:   . Active Member of Clubs or Organizations:   . Attends Banker Meetings:   Marland Kitchen Marital Status:    Hospital Course: (Per Md's admission evaluation notes): 59 year old male . Presented to ED voluntarily on 01/10/20. He reported depression and suicidal ideations. States he has  a history of depression which has been exacerbated by multiple losses of loved ones and particularly after the recent death of his grandson, who died recently. States father died a few years ago, mother died last year and sister earlier this year (both of COVID) and a brother was murdered last year. Most recently a grandson died several weeks ago (hit by a motor vehicle). He endorses neuro-vegetative symptoms as below. He also reports intermittent  psychotic symptoms, described as below. States last experienced these 2 days ago.  States he was drinking and using cocaine intermittently but recently " went on a binge", and had been drinking daily and heavily, up to 1/4 gallon of liquor, and using cocaine daily.   After  evaluation of his presenting symptoms as noted above, [name] was recommended for mood stabilization treatments. The medication regimen for his presenting symptoms were discussed & with his consent initiated. He received, stabilized & was discharged on the medications as listed below on his discharge medication lists. He was also enrolled & participated in the group counseling sessions being offered & held on this unit. He learned coping skills. He presented on this admission, other chronic medical conditions that required treatment & monitoring. He was resumed/discharged on all his pertinent home medications for those health issues. He tolerated his treatment regimen without any adverse effects or reactions reported.   During the course of his hospitalization, the 15-minute checks were adequate to ensure [name]'s safety.  Patient did not display any dangerous, violent or suicidal behavior on the unit.  He interacted with patients & staff appropriately, participated appropriately in the group sessions/therapies. His medications were addressed & adjusted to meet his needs. He was recommended for outpatient follow-up care & medication management upon discharge to assure his continuity of care.  At the time of this hospital discharge, patient is not reporting any acute suicidal/homicidal ideations. He feels more confident about his mental state. He currently denies any new issues or concerns. Education and supportive counseling provided throughout his hospital stay & upon discharge.   Today upon his discharge evaluation with the attending psychiatrist, [name] shares he is doing well. He denies any other specific concerns. He is sleeping well. His appetite is good. He denies other physical complaints. He denies AH/VH. He feels that his medications have been helpful & is in agreement to continue his current treatment regimen as recommended. He was able to engage in safety planning including [facility] or  contact emergency services if he feels unable to maintain his own safety or the safety of others. Pt had no further questions, comments, or concerns. [facility] in no apparent distress. [facility]   Physical Findings: AIMS: Facial and Oral Movements Muscles of Facial Expression: None, normal Lips and Perioral Area: None, normal Jaw: None, normal Tongue: None, normal,Extremity Movements Upper (arms, wrists, hands, fingers): None, normal Lower (legs, knees, ankles, toes): None, normal, Trunk Movements Neck, shoulders, hips: None, normal, Overall Severity Severity of abnormal movements (highest score from questions above): None, normal Incapacitation due to abnormal movements: None, normal Patient's awareness of abnormal movements (rate only patient's report): No Awareness, Dental Status Current problems with teeth and/or dentures?: No Does patient usually wear dentures?: Yes  CIWA:  CIWA-Ar Total: 0 COWS:  COWS Total Score: 1  Musculoskeletal: Strength & Muscle Tone: within normal limits Gait & Station: normal Patient leans: N/A  Psychiatric Specialty Exam: Physical Exam Vitals and nursing note reviewed.  HENT:  Head: Normocephalic.     Nose: Nose normal.     Mouth/Throat:     Pharynx: Oropharynx is clear.  Eyes:     Pupils: Pupils are equal, round, and reactive to light.  Cardiovascular:     Rate and Rhythm: Normal rate.     Pulses: Normal pulses.  Pulmonary:     Effort: Pulmonary effort is normal.  Genitourinary:    Comments: Deferred Musculoskeletal:        General: Normal range of motion.     Cervical back: Normal range of motion.  Skin:    General: Skin is warm and dry.  Neurological:     Mental Status: He is alert and oriented to person, place, and time.     Review of Systems  Constitutional: Negative for chills, diaphoresis and fever.  HENT: Negative for congestion, rhinorrhea, sneezing and sore throat.    Eyes: Negative for discharge.  Respiratory: Negative for cough, chest tightness, shortness of breath and wheezing.   Cardiovascular: Negative for chest pain and palpitations.  Gastrointestinal: Negative for diarrhea, nausea and vomiting.  Endocrine: Negative for cold intolerance.  Genitourinary: Negative for difficulty urinating.  Musculoskeletal: Negative for arthralgias and myalgias.  Allergic/Immunologic: Negative for environmental allergies and food allergies.       Allergies: Bee venom, Seafood.  Neurological: Negative for dizziness, tremors, seizures, syncope, speech difficulty, light-headedness and headaches.  Psychiatric/Behavioral: Positive for dysphoric mood (Stabilized with medication prior to discharge) and sleep disturbance (Stabilized with medication prior to discharge). Negative for agitation, behavioral problems, confusion, decreased concentration, hallucinations, self-injury and suicidal ideas. The patient is not nervous/anxious (Stable upon discharge) and is not hyperactive.     Blood pressure (!) 103/59, pulse 75, temperature 97.9 F (36.6 C), temperature source Oral, resp. rate 18, height 5\' 7"  (1.702 m), weight 63.5 kg, SpO2 100 %.Body mass index is 21.93 kg/m.  See Md's discharge SRA  Sleep:  Number of Hours: 5   Have you used any form of tobacco in the last 30 days? (Cigarettes, Smokeless Tobacco, Cigars, and/or Pipes): Yes  Has this patient used any form of tobacco in the last 30 days? (Cigarettes, Smokeless Tobacco, Cigars, and/or Pipes): Yes, an FDA-approved tobacco cessation medication was offered at discharge.  Blood Alcohol level:  Lab Results  Component Value Date   ETH <10 01/10/2020   ETH <10 12/10/2019   Metabolic Disorder Labs:  Lab Results  Component Value Date   HGBA1C 4.8 01/13/2020   MPG 91.06 01/13/2020   No results found for: PROLACTIN Lab Results  Component Value Date   CHOL 143 11/28/2019   TRIG 125 11/28/2019   HDL 49 11/28/2019    CHOLHDL 2.9 11/28/2019   VLDL 25 11/28/2019   LDLCALC 69 11/28/2019   LDLCALC 89 06/05/2019   See Psychiatric Specialty Exam and Suicide Risk Assessment completed by Attending Physician prior to discharge.  Discharge destination:  Home  Is patient on multiple antipsychotic therapies at discharge:  No   Has Patient had three or more failed trials of antipsychotic monotherapy by history:  No  Recommended Plan for Multiple Antipsychotic Therapies: NA  Allergies as of 01/17/2020      Reactions   Bee Venom    Other    No opioids or Benzo's pt is in recovery    Shellfish Allergy       Medication List    STOP taking these medications   acetaminophen 325 MG tablet Commonly known as: TYLENOL     TAKE these medications  Indication  acamprosate 333 MG tablet Commonly known as: CAMPRAL Take 1 tablet (333 mg total) by mouth 3 (three) times daily with meals. For alcoholism  Indication: Abuse or Misuse of Alcohol   albuterol 108 (90 Base) MCG/ACT inhaler Commonly known as: VENTOLIN HFA Inhale 2 puffs into the lungs 2 (two) times daily. What changed: Another medication with the same name was removed. Continue taking this medication, and follow the directions you see here.  Indication: Asthma   amLODipine 5 MG tablet Commonly known as: NORVASC Take 5 mg by mouth daily.  Indication: High Blood Pressure Disorder   atorvastatin 40 MG tablet Commonly known as: LIPITOR Take 40 mg by mouth daily.  Indication: High Amount of Fats in the Blood, High Amount of Triglycerides in the Blood   benzocaine 10 % mucosal gel Commonly known as: ORAJEL Use as directed in the mouth or throat 3 (three) times daily as needed. Mouth pain  Indication: Mouth pain   FLUoxetine 20 MG capsule Commonly known as: PROZAC Take 1 capsule (20 mg total) by mouth daily. For depression What changed: additional instructions  Indication: Depression   gabapentin 100 MG capsule Commonly known as:  NEURONTIN Take 2 capsules (200 mg total) by mouth 3 (three) times daily. For agitation What changed:   medication strength  how much to take  additional instructions  Indication: Agitation   hydrOXYzine 25 MG tablet Commonly known as: ATARAX/VISTARIL Take 1 tablet (25 mg total) by mouth every 6 (six) hours as needed. For anxiety  Indication: Feeling Anxious   nicotine 21 mg/24hr patch Commonly known as: NICODERM CQ - dosed in mg/24 hours Place 1 patch (21 mg total) onto the skin daily. (May buy from over the counter): For smoking cessation Start taking on: January 18, 2020  Indication: Nicotine Addiction   traZODone 150 MG tablet Commonly known as: DESYREL Take 1 tablet (150 mg total) by mouth at bedtime as needed for sleep. What changed:   medication strength  how much to take  when to take this  reasons to take this  Indication: Trouble Sleeping       Follow-up Information    [employer] Go on 01/17/2020.   Why: [employer] will provide transportation.  Contact information: [address] [place] [address] [phone]              Follow-up recommendations: Activity:  As tolerated Diet: As recommended by your primary care doctor. Keep all scheduled follow-up appointments as recommended.   Comments: Prescriptions given at discharge.  Patient agreeable to plan.  Given opportunity to ask questions.  Appears to feel comfortable with discharge denies any current suicidal or homicidal thought. Patient is also instructed prior to discharge to: Take all medications as prescribed by his/her mental healthcare provider. Report any adverse effects and or reactions from the medicines to his/her outpatient provider promptly. Patient has been instructed & cautioned: To not engage in alcohol and or illegal drug use while on prescription medicines. In the event of worsening symptoms, patient is instructed to call the crisis hotline,  911 and or go to the nearest ED for appropriate evaluation and treatment of symptoms. To follow-up with his/her primary care provider for your other medical issues, concerns and or health care needs.  Signed: Armandina Stammer, NP, PMHNP, FNP-BC 01/17/2020, 10:02 AM

## 2020-01-17 NOTE — Progress Notes (Signed)
  [facility] Adult Case Management Discharge Plan :  Will you be returning to the same living situation after discharge:  No. [facility] At discharge, do you have transportation home?: No. [facility] provide transportation Do you have the ability to pay for your medications: Yes,  [facility] MEDICARE  Release of information consent forms completed and in the chart;  Patient's signature needed at discharge.  Patient to Follow up at:  Follow-up Information    [employer] Go on 01/17/2020.   Why: [employer] will provide transportation.  Contact information: [address] [place] [address] [phone]               Next level of care provider has access to [facility] Link:no  Safety Planning and Suicide Prevention discussed: Yes,  Jerlyn Ly.  Have you used any form of tobacco in the last 30 days? (Cigarettes, Smokeless Tobacco, Cigars, and/or Pipes): Yes  Has patient been referred to the Quitline?: Patient refused referral  Patient has been referred for addiction treatment: Yes  Erin Sons, LCSW 01/17/2020, 9:53 AM

## 2020-01-17 NOTE — [facility] Suicide Risk Assessment (Addendum)
[facility] INPATIENT:  Family/Significant Other Suicide Prevention Education  Suicide Prevention Education:  Education Completed; Aldean Ast Apartment Manager [phone],  (name of family member/significant other) has been identified by the patient as the family member/significant other with whom the patient will be residing, and identified as the person(s) who will aid the patient in the event of a mental health crisis (suicidal ideations/suicide attempt).  With written consent from the patient, the family member/significant other has been provided the following suicide prevention education, prior to the and/or following the discharge of the patient.  The suicide prevention education provided includes the following:  Suicide risk factors  Suicide prevention and interventions  National Suicide Hotline telephone number  [facility] assessment telephone number  [facility] Emergency Assistance 911  [facility] and/or Residential Mobile Crisis Unit telephone number  Request made of family/significant other to:  Remove weapons (e.g., guns, rifles, knives), all items previously/currently identified as safety concern.    Remove drugs/medications (over-the-counter, prescriptions, illicit drugs), all items previously/currently identified as a safety concern.  The family member/significant other verbalizes understanding of the suicide prevention education information provided.  The family member/significant other agrees to remove the items of safety concern listed above.  Chad Frank 01/17/2020, 10:00 AM

## 2020-02-07 NOTE — Progress Notes (Deleted)
Colonoscopy Oct 2014 at [facility] normal.

## 2020-02-08 ENCOUNTER — Encounter: Payer: Self-pay | Admitting: Internal Medicine

## 2020-02-08 ENCOUNTER — Ambulatory Visit: Payer: Medicare Other | Admitting: Gastroenterology

## 2023-06-18 ENCOUNTER — Ambulatory Visit: Payer: Medicare HMO | Admitting: Family Medicine
